# Patient Record
Sex: Female | Born: 1955
Health system: Southern US, Community
[De-identification: ages and names within clinical notes are randomized; demographics above are authoritative.]

## PROBLEM LIST (undated history)

## (undated) DIAGNOSIS — D250 Submucous leiomyoma of uterus: Secondary | ICD-10-CM

## (undated) DIAGNOSIS — N950 Postmenopausal bleeding: Secondary | ICD-10-CM

## (undated) DIAGNOSIS — D219 Benign neoplasm of connective and other soft tissue, unspecified: Secondary | ICD-10-CM

## (undated) DIAGNOSIS — D500 Iron deficiency anemia secondary to blood loss (chronic): Secondary | ICD-10-CM

## (undated) DIAGNOSIS — Z972 Presence of dental prosthetic device (complete) (partial): Secondary | ICD-10-CM

## (undated) HISTORY — PX: NO PAST SURGERIES: SHX2092

---

## 1998-02-05 ENCOUNTER — Ambulatory Visit (HOSPITAL_COMMUNITY): Admission: RE | Admit: 1998-02-05 | Discharge: 1998-02-05 | Payer: Self-pay | Admitting: Gastroenterology

## 1998-04-30 ENCOUNTER — Observation Stay (HOSPITAL_COMMUNITY): Admission: EM | Admit: 1998-04-30 | Discharge: 1998-04-30 | Payer: Self-pay | Admitting: Emergency Medicine

## 1998-11-22 ENCOUNTER — Emergency Department (HOSPITAL_COMMUNITY): Admission: EM | Admit: 1998-11-22 | Discharge: 1998-11-22 | Payer: Self-pay | Admitting: Emergency Medicine

## 2000-03-25 ENCOUNTER — Encounter: Admission: RE | Admit: 2000-03-25 | Discharge: 2000-03-25 | Payer: Self-pay

## 2000-04-01 ENCOUNTER — Encounter: Admission: RE | Admit: 2000-04-01 | Discharge: 2000-04-01 | Payer: Self-pay

## 2000-04-29 ENCOUNTER — Encounter: Admission: RE | Admit: 2000-04-29 | Discharge: 2000-04-29 | Payer: Self-pay | Admitting: Internal Medicine

## 2000-05-23 ENCOUNTER — Encounter: Admission: RE | Admit: 2000-05-23 | Discharge: 2000-05-23 | Payer: Self-pay | Admitting: Internal Medicine

## 2000-08-02 ENCOUNTER — Encounter: Admission: RE | Admit: 2000-08-02 | Discharge: 2000-08-02 | Payer: Self-pay | Admitting: Internal Medicine

## 2000-08-11 ENCOUNTER — Emergency Department (HOSPITAL_COMMUNITY): Admission: EM | Admit: 2000-08-11 | Discharge: 2000-08-11 | Payer: Self-pay | Admitting: Internal Medicine

## 2000-08-21 ENCOUNTER — Emergency Department (HOSPITAL_COMMUNITY): Admission: EM | Admit: 2000-08-21 | Discharge: 2000-08-21 | Payer: Self-pay | Admitting: Emergency Medicine

## 2000-08-25 ENCOUNTER — Encounter: Admission: RE | Admit: 2000-08-25 | Discharge: 2000-08-25 | Payer: Self-pay | Admitting: Internal Medicine

## 2000-11-16 ENCOUNTER — Emergency Department (HOSPITAL_COMMUNITY): Admission: EM | Admit: 2000-11-16 | Discharge: 2000-11-17 | Payer: Self-pay | Admitting: Emergency Medicine

## 2000-11-18 ENCOUNTER — Encounter: Admission: RE | Admit: 2000-11-18 | Discharge: 2000-11-18 | Payer: Self-pay | Admitting: Internal Medicine

## 2000-12-01 ENCOUNTER — Emergency Department (HOSPITAL_COMMUNITY): Admission: EM | Admit: 2000-12-01 | Discharge: 2000-12-01 | Payer: Self-pay | Admitting: *Deleted

## 2000-12-08 ENCOUNTER — Emergency Department (HOSPITAL_COMMUNITY): Admission: EM | Admit: 2000-12-08 | Discharge: 2000-12-08 | Payer: Self-pay | Admitting: Emergency Medicine

## 2000-12-09 ENCOUNTER — Encounter: Payer: Self-pay | Admitting: Emergency Medicine

## 2001-04-23 ENCOUNTER — Inpatient Hospital Stay (HOSPITAL_COMMUNITY): Admission: EM | Admit: 2001-04-23 | Discharge: 2001-04-24 | Payer: Self-pay | Admitting: *Deleted

## 2001-08-10 ENCOUNTER — Encounter: Admission: RE | Admit: 2001-08-10 | Discharge: 2001-08-10 | Payer: Self-pay | Admitting: Internal Medicine

## 2001-12-04 ENCOUNTER — Encounter: Admission: RE | Admit: 2001-12-04 | Discharge: 2001-12-04 | Payer: Self-pay | Admitting: Internal Medicine

## 2001-12-18 ENCOUNTER — Encounter: Admission: RE | Admit: 2001-12-18 | Discharge: 2001-12-18 | Payer: Self-pay | Admitting: Internal Medicine

## 2002-02-01 ENCOUNTER — Encounter: Admission: RE | Admit: 2002-02-01 | Discharge: 2002-02-01 | Payer: Self-pay | Admitting: Family Medicine

## 2002-02-01 ENCOUNTER — Encounter: Payer: Self-pay | Admitting: Family Medicine

## 2002-05-15 ENCOUNTER — Emergency Department (HOSPITAL_COMMUNITY): Admission: EM | Admit: 2002-05-15 | Discharge: 2002-05-15 | Payer: Self-pay | Admitting: Emergency Medicine

## 2002-05-18 ENCOUNTER — Encounter: Admission: RE | Admit: 2002-05-18 | Discharge: 2002-05-18 | Payer: Self-pay | Admitting: Internal Medicine

## 2003-06-07 ENCOUNTER — Emergency Department (HOSPITAL_COMMUNITY): Admission: EM | Admit: 2003-06-07 | Discharge: 2003-06-07 | Payer: Self-pay | Admitting: Family Medicine

## 2003-06-12 ENCOUNTER — Encounter: Admission: RE | Admit: 2003-06-12 | Discharge: 2003-06-12 | Payer: Self-pay | Admitting: Internal Medicine

## 2003-06-20 ENCOUNTER — Emergency Department (HOSPITAL_COMMUNITY): Admission: EM | Admit: 2003-06-20 | Discharge: 2003-06-20 | Payer: Self-pay | Admitting: Family Medicine

## 2009-01-15 ENCOUNTER — Emergency Department (HOSPITAL_COMMUNITY): Admission: EM | Admit: 2009-01-15 | Discharge: 2009-01-15 | Payer: Self-pay | Admitting: Emergency Medicine

## 2010-04-13 ENCOUNTER — Emergency Department (HOSPITAL_COMMUNITY): Payer: No Typology Code available for payment source

## 2010-04-13 ENCOUNTER — Emergency Department (HOSPITAL_COMMUNITY)
Admission: EM | Admit: 2010-04-13 | Discharge: 2010-04-13 | Disposition: A | Discharge status: Home / Self Care | Payer: No Typology Code available for payment source | Attending: Emergency Medicine | Admitting: Emergency Medicine

## 2010-04-13 DIAGNOSIS — R079 Chest pain, unspecified: Secondary | ICD-10-CM | POA: Insufficient documentation

## 2010-04-13 DIAGNOSIS — M546 Pain in thoracic spine: Secondary | ICD-10-CM | POA: Insufficient documentation

## 2010-04-13 DIAGNOSIS — S139XXA Sprain of joints and ligaments of unspecified parts of neck, initial encounter: Secondary | ICD-10-CM | POA: Insufficient documentation

## 2010-04-13 DIAGNOSIS — M549 Dorsalgia, unspecified: Secondary | ICD-10-CM | POA: Insufficient documentation

## 2010-04-13 DIAGNOSIS — M542 Cervicalgia: Secondary | ICD-10-CM | POA: Insufficient documentation

## 2010-04-13 DIAGNOSIS — IMO0002 Reserved for concepts with insufficient information to code with codable children: Secondary | ICD-10-CM | POA: Insufficient documentation

## 2010-04-13 DIAGNOSIS — M25569 Pain in unspecified knee: Secondary | ICD-10-CM | POA: Insufficient documentation

## 2010-07-03 NOTE — Discharge Summary (Signed)
[place]  Patient:    Lindsay Carroll, Lindsay Carroll Visit Number: [id number] MRN: [id number]          Service Type: MED Location: [id number] Attending Physician:  Levy Sjogren Dictated by:   Jennette Kettle, M.D. Admit Date:  04/23/2001 Discharge Date: 04/24/2001   CC:         Lindsay Carroll, M.D.   Discharge Summary  ATTENDING PHYSICIAN:  Fransisco Hertz, M.D.  MEDICAL RESIDENT:  Jennette Kettle, M.D.  DISCHARGE DIAGNOSES: 1. 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 2. Patient has a history of ALLERGIES to APPLE PRODUCTS. 3. Status post polypectomy 1997 (benign).  DISCHARGE MEDICATIONS: 1. Prednisone 20 mg p.o. q.d. times five days until complete. 2. EpiPen 0.3 mg autoinjector, one injection p.r.n. anaphylaxis.  FOLLOW UP: Patient has a follow up appointment with Dr. Rennis Carroll on May 17, 2001 at 2:55 p.m.  This is in Dr. Norval Gable continuity clinic.  The patient needs EpiPen prescriptions q. yearly.  ADMISSION HISTORY OF PRESENT ILLNESS:  Briefly, the patient is a 55 year old African-American female with history of allergic reaction to apple products. On Sunday 04/23/01 the patient began having an anaphylactic reaction to cranberry juice which she took that morning.  She began having increased pruritus on her hands and her chest.  The patients tongue began to swell. This was associated with nausea and vomiting, abdominal pain.  The patient had a brief syncopal episode with loss of consciousness two to three minutes without seizure activity.  EMS was called as the patient was at [facility].  At the time EMS arrived the patients blood pressure was decreased at 60/40 and the patient was diaphoretic.  The patient was given racemic epinephrine times one as well as Benadryl and was brought to the emergency department.  ADMISSION LABORATORY DATA:  CBC:  Revealed white blood cell count 20.1, (steroids had already been given at this point), hemoglobin 11.4,  platelet count 387K.  ELECTROLYTES:  Sodium 138, potassium 3.9, chloride 106, bicarb 25, glucose 110, BUN 6, creatinine 0.7, bilirubin 0.5.  Alkaline phosphatase was 68, AST was 24, ALT 23, protein 7.1, albumin 3.3, calcium 8.4.  HOSPITAL COURSE: #1 - ANAPHYLAXIS REACTION TO APPLE PRODUCTS.  The patient was initially admitted for 23 hour observation.  The patient upon arrival to the emergency department was stable with a blood pressure of 101/84 which was increased from her initial EMS report of 60/40.  The patient was not in any respiratory distress.  There was no evidence of stridor on physical examination.  The patients room air saturations were 99%.  The patient was monitored for 24 hours without any subsequent decline in her respiratory status or any new increased pleuritis or skin lesions.  The patient was given epinephrine and Benadryl en route and was placed on intravenous Solu-Medrol for her brief hospital course.  The patient was discharged on prednisone 20 mg p.o. q.d. for the next five days.  The patient was also given a prescription for an EpiPen. The patients last EpiPen refill according to the patient was two to three years ago.  The patient was advised on a yearly basis that she have her EpiPen replaced if she has not used it.  The patient was also advised to carry her EpiPen with her at all times.  The patient did not have her EpiPen with her when she was at [facility] the morning of admission.  The patient did have an elevated white count upon admission, however, steroids had  already been given.  The patients discharge white blood cell count was 19.5.  FOLLOW UP:  According to the patient it has been quite  a while since she has seen a primary care physician.  We will arrange for follow up 05/17/01 with her primary care physician for routine health maintenance. Dictated by:   Jennette Kettle, M.D. Attending Physician:  Levy Sjogren DD:  04/24/01 TD:   04/25/01 Job: [id number]

## 2012-09-08 ENCOUNTER — Emergency Department (HOSPITAL_COMMUNITY)
Admission: EM | Admit: 2012-09-08 | Discharge: 2012-09-08 | Disposition: A | Discharge status: Home / Self Care | Payer: Self-pay | Attending: Emergency Medicine | Admitting: Emergency Medicine

## 2012-09-08 ENCOUNTER — Encounter (HOSPITAL_COMMUNITY): Payer: Self-pay

## 2012-09-08 ENCOUNTER — Emergency Department (HOSPITAL_COMMUNITY): Payer: Self-pay

## 2012-09-08 DIAGNOSIS — IMO0001 Reserved for inherently not codable concepts without codable children: Secondary | ICD-10-CM | POA: Insufficient documentation

## 2012-09-08 DIAGNOSIS — J3489 Other specified disorders of nose and nasal sinuses: Secondary | ICD-10-CM | POA: Insufficient documentation

## 2012-09-08 DIAGNOSIS — R51 Headache: Secondary | ICD-10-CM | POA: Insufficient documentation

## 2012-09-08 DIAGNOSIS — R000 Tachycardia, unspecified: Secondary | ICD-10-CM | POA: Insufficient documentation

## 2012-09-08 DIAGNOSIS — Z79899 Other long term (current) drug therapy: Secondary | ICD-10-CM | POA: Insufficient documentation

## 2012-09-08 DIAGNOSIS — J159 Unspecified bacterial pneumonia: Secondary | ICD-10-CM | POA: Insufficient documentation

## 2012-09-08 DIAGNOSIS — J189 Pneumonia, unspecified organism: Secondary | ICD-10-CM

## 2012-09-08 DIAGNOSIS — R197 Diarrhea, unspecified: Secondary | ICD-10-CM | POA: Insufficient documentation

## 2012-09-08 DIAGNOSIS — R112 Nausea with vomiting, unspecified: Secondary | ICD-10-CM | POA: Insufficient documentation

## 2012-09-08 DIAGNOSIS — R05 Cough: Secondary | ICD-10-CM | POA: Insufficient documentation

## 2012-09-08 DIAGNOSIS — R059 Cough, unspecified: Secondary | ICD-10-CM | POA: Insufficient documentation

## 2012-09-08 LAB — COMPREHENSIVE METABOLIC PANEL
AST: 52 U/L — ABNORMAL HIGH (ref 0–37)
Albumin: 3.5 g/dL (ref 3.5–5.2)
BUN: 9 mg/dL (ref 6–23)
Calcium: 9.1 mg/dL (ref 8.4–10.5)
Creatinine, Ser: 0.76 mg/dL (ref 0.50–1.10)
GFR calc non Af Amer: >90 mL/min (ref >90)

## 2012-09-08 LAB — CBC WITH DIFFERENTIAL/PLATELET
Basophils Absolute: 0 10*3/uL (ref 0.0–0.1)
Lymphs Abs: 2.1 10*3/uL (ref 0.7–4.0)
MCH: 27.1 pg (ref 26.0–34.0)
MCHC: 32.8 g/dL (ref 30.0–36.0)
MCV: 82.6 fL (ref 78.0–100.0)
Monocytes Absolute: 1.5 10*3/uL — ABNORMAL HIGH (ref 0.1–1.0)
Neutro Abs: 11.6 10*3/uL — ABNORMAL HIGH (ref 1.7–7.7)
Platelets: 217 10*3/uL (ref 150–400)
RDW: 13.5 % (ref 11.5–15.5)

## 2012-09-08 MED ORDER — ACETAMINOPHEN 325 MG PO TABS
ORAL_TABLET | ORAL | Status: AC
  Filled 2012-09-08: qty 2

## 2012-09-08 MED ORDER — AZITHROMYCIN 250 MG PO TABS
500.0000 mg | ORAL_TABLET | Freq: Once | ORAL | Status: AC
  Administered 2012-09-08: 500 mg via ORAL
  Filled 2012-09-08: qty 2

## 2012-09-08 MED ORDER — ONDANSETRON HCL 4 MG/2ML IJ SOLN
4.0000 mg | Freq: Once | INTRAMUSCULAR | Status: AC
  Administered 2012-09-08: 4 mg via INTRAVENOUS
  Filled 2012-09-08: qty 2

## 2012-09-08 MED ORDER — SODIUM CHLORIDE 0.9 % IV BOLUS (SEPSIS)
1000.0000 mL | Freq: Once | INTRAVENOUS | Status: AC
  Administered 2012-09-08: 1000 mL via INTRAVENOUS

## 2012-09-08 MED ORDER — ACETAMINOPHEN 325 MG PO TABS
650.0000 mg | ORAL_TABLET | Freq: Once | ORAL | Status: AC
  Administered 2012-09-08: 650 mg via ORAL

## 2012-09-08 MED ORDER — CEPHALEXIN 500 MG PO CAPS: 500.0000 mg | ORAL_CAPSULE | Freq: Four times a day (QID) | ORAL | Status: DC

## 2012-09-08 MED ORDER — AZITHROMYCIN 250 MG PO TABS: 250.0000 mg | ORAL_TABLET | Freq: Every day | ORAL | Status: DC

## 2012-09-08 MED ORDER — IBUPROFEN 200 MG PO TABS
600.0000 mg | ORAL_TABLET | Freq: Once | ORAL | Status: AC
  Administered 2012-09-08: 600 mg via ORAL
  Filled 2012-09-08: qty 3

## 2012-09-08 MED ORDER — DEXTROSE 5 % IV SOLN
1.0000 g | Freq: Once | INTRAVENOUS | Status: AC
  Administered 2012-09-08: 1 g via INTRAVENOUS
  Filled 2012-09-08: qty 10

## 2012-09-08 NOTE — ED Notes (Signed)
Pt c/o flu like s/s since Tuesday with chills/fever, then a severe headache on Wednesday, n/v/d today

## 2012-09-08 NOTE — ED Provider Notes (Signed)
CSN: [id number]     Arrival date & time 09/08/12  1317 History     First MD Initiated Contact with Patient 09/08/12 1333     Chief Complaint  Patient presents with  . Headache  . Fever  . Emesis  . Diarrhea   (Consider location/radiation/quality/duration/timing/severity/associated sxs/prior Treatment) Patient is a 57 y.o. female presenting with fever, vomiting, and diarrhea. The history is provided by the patient.  Fever Max temp prior to arrival:  102 Temp source:  Oral Severity:  Moderate Onset quality:  Gradual Duration:  4 days Timing:  Constant Progression:  Worsening Chronicity:  New Relieved by:  Acetaminophen Worsened by:  Nothing tried Associated symptoms: chills, congestion, cough, diarrhea, myalgias, nausea and vomiting   Associated symptoms: no chest pain, no confusion, no dysuria, no ear pain, no rash and no sore throat   Cough:    Cough characteristics:  Non-productive, dry and hacking   Severity:  Moderate   Onset quality:  Gradual   Duration:  4 days   Timing:  Constant   Progression:  Unchanged   Chronicity:  New Diarrhea:    Quality:  Watery   Number of occurrences:  3   Severity:  Mild   Duration:  1 day   Timing:  Intermittent   Progression:  Unchanged Vomiting:    Quality:  Stomach contents   Number of occurrences:  2   Severity:  Mild   Duration:  1 day   Timing:  Sporadic   Progression:  Unchanged Risk factors comment:  Possible sick contacts Emesis Associated symptoms: chills, diarrhea and myalgias   Associated symptoms: no abdominal pain and no sore throat   Diarrhea Associated symptoms: chills, fever, myalgias and vomiting   Associated symptoms: no abdominal pain     History reviewed. No pertinent past medical history. History reviewed. No pertinent past surgical history. No family history on file. History  Substance Use Topics  . Smoking status: Never Smoker   . Smokeless tobacco: Not on file  . Alcohol Use: No   OB  History   Grav Para Term Preterm Abortions TAB SAB Ect Mult Living                 Review of Systems  Constitutional: Positive for fever and chills.  HENT: Positive for congestion. Negative for ear pain, sore throat, facial swelling, neck pain and neck stiffness.   Respiratory: Positive for cough.   Cardiovascular: Negative for chest pain.  Gastrointestinal: Positive for nausea, vomiting and diarrhea. Negative for abdominal pain.  Genitourinary: Negative for dysuria.  Musculoskeletal: Positive for myalgias.  Skin: Negative for rash.  Psychiatric/Behavioral: Negative for confusion and decreased concentration.  All other systems reviewed and are negative.    Allergies  Apple  Home Medications   Current Outpatient Rx  Name  Route  Sig  Dispense  Refill  . dextromethorphan (DELSYM) 30 MG/5ML liquid   Oral   Take 60 mg by mouth as needed for cough.         Marland Kitchen ibuprofen (ADVIL,MOTRIN) 200 MG tablet   Oral   Take 400 mg by mouth every 6 (six) hours as needed for pain.         . naproxen sodium (ANAPROX) 220 MG tablet   Oral   Take 220 mg by mouth 2 (two) times daily with a meal.          BP 129/73  Pulse 125  Temp(Src) 103.2 F (39.6 C)  Resp 16  SpO2 96% Physical Exam  Nursing note and vitals reviewed. Constitutional: She is oriented to person, place, and time. She appears well-developed and well-nourished. No distress.  HENT:  Head: Normocephalic and atraumatic.  Mouth/Throat: Oropharynx is clear and moist. Mucous membranes are dry.  Eyes: Conjunctivae and EOM are normal. Pupils are equal, round, and reactive to light.  Neck: Normal range of motion. Neck supple. No spinous process tenderness and no muscular tenderness present. No Brudzinski's sign and no Kernig's sign noted.  Cardiovascular: Regular rhythm and intact distal pulses.  Tachycardia present.   No murmur heard. Pulmonary/Chest: Effort normal and breath sounds normal. No respiratory distress. She has  no wheezes. She has no rales.  Abdominal: Soft. She exhibits no distension. There is no tenderness. There is no rebound and no guarding.  Musculoskeletal: Normal range of motion. She exhibits no edema and no tenderness.  Neurological: She is alert and oriented to person, place, and time.  Skin: Skin is warm and dry. No rash noted. No erythema.  Psychiatric: She has a normal mood and affect. Her behavior is normal.    ED Course   Procedures (including critical care time)  Labs Reviewed  CBC WITH DIFFERENTIAL - Abnormal; Notable for the following:    WBC 15.2 (*)    Neutro Abs 11.6 (*)    Monocytes Absolute 1.5 (*)    All other components within normal limits  COMPREHENSIVE METABOLIC PANEL - Abnormal; Notable for the following:    Sodium 130 (*)    Potassium 3.3 (*)    Chloride 92 (*)    Glucose, Bld 227 (*)    AST 52 (*)    ALT 47 (*)    All other components within normal limits  URINALYSIS, ROUTINE W REFLEX MICROSCOPIC   Dg Chest 2 View  09/08/2012   *RADIOLOGY REPORT*  Clinical Data: Cough and fever.  CHEST - 2 VIEW  Comparison: None.  Findings: Confluent opacity is seen in the anterior and medial right upper lobe, suspicious for pneumonia.  The left lung is clear.  No evidence of pleural effusion.  Heart size is normal.  IMPRESSION: Confluent opacity in the anterior and medial right upper lobe, suspicious for pneumonia.  Radiographic followup is recommended to confirm resolution.   Original Report Authenticated By: Myles Rosenthal, M.D.   1. CAP (community acquired pneumonia)     MDM   Patient with 4 days of flulike illness with fever, chills, cough and vomiting and diarrhea that started today.  Patient is febrile here to 103 and tachycardic. She is nontoxic appearing and does not display any symptoms concerning for meningitis. She denies any possibility of tick bite and has no rash. She has no focal abdominal tenderness and has clear lungs but has complained of a cough.  CBC, CMP,  UA, chest x-ray pending. Patient has no prior medical history and takes no medications regularly. Feel most likely this is viral in nature however will rule out pneumonia or UTI as the cause of patient's symptoms.  2:37 PM CXR consistent with PNA which is consistent with her sx.  This is community acquired.  Normal sats and no med problems.  Will ensure tolerating po's but feel can go home.  Given rocephin and azithro here.  3:50 PM HR improved to 100 and temp still 102 so pt given ibuprofen.  Gwyneth Sprout, MD 09/08/12 1550

## 2012-09-08 NOTE — ED Notes (Signed)
Spoke with MD. Will recheck pt temp in 30 minutes then discharge.

## 2012-09-08 NOTE — Progress Notes (Signed)
[facility] provided pt with [facility] app, as well as, a list of primary care resources.  °

## 2012-10-10 ENCOUNTER — Emergency Department (INDEPENDENT_AMBULATORY_CARE_PROVIDER_SITE_OTHER)
Admission: EM | Admit: 2012-10-10 | Discharge: 2012-10-10 | Disposition: A | Discharge status: Home / Self Care | Payer: Self-pay | Source: Home / Self Care

## 2012-10-10 ENCOUNTER — Encounter (HOSPITAL_COMMUNITY): Payer: Self-pay | Admitting: Emergency Medicine

## 2012-10-10 DIAGNOSIS — I10 Essential (primary) hypertension: Secondary | ICD-10-CM

## 2012-10-10 DIAGNOSIS — J069 Acute upper respiratory infection, unspecified: Secondary | ICD-10-CM

## 2012-10-10 LAB — POCT RAPID STREP A: Streptococcus, Group A Screen (Direct): NEGATIVE

## 2012-10-10 MED ORDER — GUAIFENESIN-CODEINE 100-10 MG/5ML PO SOLN: 5.0000 mL | Freq: Three times a day (TID) | ORAL | Status: DC | PRN

## 2012-10-10 NOTE — ED Notes (Signed)
Pt c/o sore throat, fatigue, running nose, aching and uncontrollable coughing x 2 days with sxs worsening. Pt stated she had pneumonia 4 wks ago cough lightened but never went away. Pt took OTC medications for cough and cold with no relief. Pt also states she has pain in her shoulders and back x 2 days.

## 2012-10-10 NOTE — ED Provider Notes (Signed)
CSN: [id number]     Arrival date & time 10/10/12  1649 History   None    Chief Complaint  Patient presents with  . Sore Throat   (Consider location/radiation/quality/duration/timing/severity/associated sxs/prior Treatment) Patient is a 57 y.o. female presenting with pharyngitis.  Sore Throat Pertinent negatives include no abdominal pain.    Runny nose, back ache, sore throat, mild HA, cough and sneezing. Started 2 days ago. Denies CP, SOB, HA, syncope, palpitations, n/v/d/c. CAP 1 month ago, treated successfully w/ ABX but stopped taking Keflex 2 wks ago. Finished Azithro dosing.    History reviewed. No pertinent past medical history. History reviewed. No pertinent past surgical history. Family History  Problem Relation Age of Onset  . Cancer Mother 21    colon  . Cancer Father 40    colon   History  Substance Use Topics  . Smoking status: Never Smoker   . Smokeless tobacco: Not on file  . Alcohol Use: No   OB History   Grav Para Term Preterm Abortions TAB SAB Ect Mult Living                 Review of Systems  Constitutional: Negative for chills, activity change and unexpected weight change.  Gastrointestinal: Negative for nausea, vomiting, abdominal pain, diarrhea, constipation, blood in stool, anal bleeding and rectal pain.  All other systems reviewed and are negative.    Allergies  Apple  Home Medications   Current Outpatient Rx  Name  Route  Sig  Dispense  Refill  . azithromycin (ZITHROMAX) 250 MG tablet   Oral   Take 1 tablet (250 mg total) by mouth daily. Take first 2 tablets together, then 1 every day until finished.   6 tablet   0   . cephALEXin (KEFLEX) 500 MG capsule   Oral   Take 1 capsule (500 mg total) by mouth 4 (four) times daily.   28 capsule   0   . dextromethorphan (DELSYM) 30 MG/5ML liquid   Oral   Take 60 mg by mouth as needed for cough.         Marland Kitchen ibuprofen (ADVIL,MOTRIN) 200 MG tablet   Oral   Take 400 mg by mouth every 6  (six) hours as needed for pain.         . naproxen sodium (ANAPROX) 220 MG tablet   Oral   Take 220 mg by mouth 2 (two) times daily with a meal.          BP 164/94  Pulse 96  Temp(Src) 99.1 F (37.3 C) (Oral)  Resp 18  SpO2 100% Physical Exam  Constitutional: She is oriented to person, place, and time. She appears well-developed and well-nourished.  HENT:  Head: Normocephalic and atraumatic.  Right Ear: External ear normal.  Left Ear: External ear normal.  Mouth/Throat: Oropharynx is clear and moist. No oropharyngeal exudate.  Eyes: EOM are normal. Pupils are equal, round, and reactive to light.  Neck: Normal range of motion. Neck supple. No thyromegaly present.  Cardiovascular: Normal rate and intact distal pulses.   Murmur (II/VI) heard. Pulmonary/Chest: Effort normal and breath sounds normal. No respiratory distress. She has no wheezes. She has no rales. She exhibits no tenderness.  Abdominal: Bowel sounds are normal. She exhibits no distension.  Musculoskeletal: Normal range of motion.  Neurological: She is alert and oriented to person, place, and time.  Skin: Skin is warm and dry.  Psychiatric: She has a normal mood and affect. Her behavior is normal.  Judgment and thought content normal.    ED Course  Procedures (including critical care time) Labs Review Labs Reviewed - No data to display Imaging Review No results found.  MDM   1. Viral URI with cough   2. Hypertension    57yo AAF. W/ likely viral URIa dn HTN. Likely to resolve in 3-4 days - OTC motrin, Afrin, robitussin w/ codeine, saline nasal spray. Sudafed but may cause increase in BP. Pt to check BP at home and if increases above 160 SBP or feels symptomatic will not take - precautions given adn all questions answered -f/u w/ PCP for HTN.   Shelly Flatten, MD Family Medicine PGY-3 10/10/2012, 6:15 PM      Ozella Rocks, MD 10/10/12 Rickey Primus

## 2012-10-11 NOTE — ED Provider Notes (Signed)
Medical screening examination/treatment/procedure(s) were performed by a resident physician or non-physician practitioner and as the supervising physician I was immediately available for consultation/collaboration.  Clementeen Graham, MD   Rodolph Bong, MD 10/11/12 [phone]

## 2012-10-12 LAB — CULTURE, GROUP A STREP

## 2015-03-30 ENCOUNTER — Emergency Department (HOSPITAL_COMMUNITY)
Admission: EM | Admit: 2015-03-30 | Discharge: 2015-03-30 | Disposition: A | Discharge status: Home / Self Care | Payer: Self-pay | Attending: Emergency Medicine | Admitting: Emergency Medicine

## 2015-03-30 ENCOUNTER — Encounter (HOSPITAL_COMMUNITY): Payer: Self-pay | Admitting: Emergency Medicine

## 2015-03-30 ENCOUNTER — Emergency Department (HOSPITAL_COMMUNITY): Payer: Self-pay

## 2015-03-30 DIAGNOSIS — Z792 Long term (current) use of antibiotics: Secondary | ICD-10-CM | POA: Insufficient documentation

## 2015-03-30 DIAGNOSIS — J069 Acute upper respiratory infection, unspecified: Secondary | ICD-10-CM | POA: Insufficient documentation

## 2015-03-30 DIAGNOSIS — Z791 Long term (current) use of non-steroidal anti-inflammatories (NSAID): Secondary | ICD-10-CM | POA: Insufficient documentation

## 2015-03-30 LAB — RAPID STREP SCREEN (MED CTR MEBANE ONLY): Streptococcus, Group A Screen (Direct): NEGATIVE

## 2015-03-30 MED ORDER — ACETAMINOPHEN 325 MG PO TABS
650.0000 mg | ORAL_TABLET | Freq: Once | ORAL | Status: AC | PRN
  Administered 2015-03-30: 650 mg via ORAL
  Filled 2015-03-30: qty 2

## 2015-03-30 MED ORDER — FLUTICASONE PROPIONATE 50 MCG/ACT NA SUSP: 2.0000 | Freq: Every day | NASAL | Status: DC

## 2015-03-30 MED ORDER — BENZONATATE 100 MG PO CAPS: 100.0000 mg | ORAL_CAPSULE | Freq: Three times a day (TID) | ORAL | Status: DC

## 2015-03-30 NOTE — ED Notes (Signed)
Patient presents for non productive cough x3 days, upper back pain. Denies fever/chills, N/V/D.

## 2015-03-30 NOTE — ED Provider Notes (Signed)
CSN: [id number]     Arrival date & time 03/30/15  1730 History   First MD Initiated Contact with Patient 03/30/15 2040     Chief Complaint  Patient presents with  . Cough     (Consider location/radiation/quality/duration/timing/severity/associated sxs/prior Treatment) Patient is a 60 y.o. female presenting with cough. The history is provided by the patient and medical records. No language interpreter was used.  Cough Associated symptoms: sore throat   Associated symptoms: no chills, no fever, no headaches, no myalgias, no rash and no wheezing      Lindsay Carroll is a 60 y.o. female  With no pertinent PMH who presents to the Emergency Department complaining of persistent dry cough x 2 days. Associated symptoms include sore throat and nasal congestion. Patient denies fever at home, however was febrile upon arrival. Denies n/v/d. Denies chest pain. No medication or treatments taken PTA. No alleviating or aggravating factors noted. Never a smoker, no history of COPD or asthma.   History reviewed. No pertinent past medical history. History reviewed. No pertinent past surgical history. Family History  Problem Relation Age of Onset  . Cancer Mother 66    colon  . Cancer Father 50    colon   Social History  Substance Use Topics  . Smoking status: Never Smoker   . Smokeless tobacco: None  . Alcohol Use: No   OB History    No data available     Review of Systems  Constitutional: Negative for fever, chills and appetite change.  HENT: Positive for congestion and sore throat.   Eyes: Negative for visual disturbance.  Respiratory: Positive for cough. Negative for wheezing.   Cardiovascular: Negative.   Gastrointestinal: Negative for nausea, vomiting and abdominal pain.  Genitourinary: Negative for dysuria.  Musculoskeletal: Negative for myalgias and neck pain.  Skin: Negative for rash.  Neurological: Negative for dizziness, weakness and headaches.      Allergies   Apple  Home Medications   Prior to Admission medications   Medication Sig Start Date End Date Taking? Authorizing Provider  azithromycin (ZITHROMAX) 250 MG tablet Take 1 tablet (250 mg total) by mouth daily. Take first 2 tablets together, then 1 every day until finished. 09/08/12   Blanchie Dessert, MD  benzonatate (TESSALON) 100 MG capsule Take 1 capsule (100 mg total) by mouth every 8 (eight) hours. 03/30/15   Ozella Almond Chibuikem Thang, PA-C  cephALEXin (KEFLEX) 500 MG capsule Take 1 capsule (500 mg total) by mouth 4 (four) times daily. 09/08/12   Blanchie Dessert, MD  dextromethorphan (DELSYM) 30 MG/5ML liquid Take 60 mg by mouth as needed for cough.    Historical Provider, MD  fluticasone (FLONASE) 50 MCG/ACT nasal spray Place 2 sprays into both nostrils daily. 03/30/15   Amerie Beaumont Pilcher Naraly Fritcher, PA-C  guaiFENesin-codeine 100-10 MG/5ML syrup Take 5 mLs by mouth 3 (three) times daily as needed for cough. 10/10/12   Waldemar Dickens, MD  ibuprofen (ADVIL,MOTRIN) 200 MG tablet Take 400 mg by mouth every 6 (six) hours as needed for pain.    Historical Provider, MD  naproxen sodium (ANAPROX) 220 MG tablet Take 220 mg by mouth 2 (two) times daily with a meal.    Historical Provider, MD   BP 128/87 mmHg  Pulse 100  Temp(Src) 101.2 F (38.4 C) (Oral)  Resp 18  SpO2 98% Physical Exam  Constitutional: She is oriented to person, place, and time. She appears well-developed and well-nourished.  Alert and in no acute distress  HENT:  Head:  Normocephalic and atraumatic.  + nasal congestion, OP with erythema, no exudates.   Neck: Normal range of motion.  Cardiovascular: Normal rate, regular rhythm and normal heart sounds.  Exam reveals no gallop and no friction rub.   No murmur heard. Pulmonary/Chest: Effort normal and breath sounds normal. No respiratory distress. She has no wheezes. She has no rales. She exhibits no tenderness.  Abdominal: Soft. She exhibits no distension. There is no tenderness.   Musculoskeletal: She exhibits no edema.  Lymphadenopathy:    She has cervical adenopathy.  Neurological: She is alert and oriented to person, place, and time.  Skin: Skin is warm and dry. No rash noted.  Psychiatric: She has a normal mood and affect. Her behavior is normal. Judgment and thought content normal.  Nursing note and vitals reviewed.   ED Course  Procedures (including critical care time) Labs Review Labs Reviewed  RAPID STREP SCREEN (NOT AT [facility])  CULTURE, GROUP A STREP [facility])    Imaging Review Dg Chest 2 View  03/30/2015  CLINICAL DATA:  Three day history of fever, productive cough, chest congestion and shortness of breath. Personal history of right upper lobe pneumonia. EXAM: CHEST  2 VIEW COMPARISON:  09/08/2012. FINDINGS: Cardiac silhouette upper normal in size to slightly enlarged, unchanged. Hilar and mediastinal contours otherwise unremarkable. Lungs clear. Bronchovascular markings normal. Pulmonary vascularity normal. No visible pleural effusions. No pneumothorax. No evidence of significant scarring at the site of the prior right upper lobe pneumonia. Visualized bony thorax intact. IMPRESSION: Stable borderline heart size.  No acute cardiopulmonary disease. Electronically Signed   By: Evangeline Dakin M.D.   On: 03/30/2015 18:22   I have personally reviewed and evaluated these images and lab results as part of my medical decision-making.   EKG Interpretation None      MDM   Final diagnoses:  URI (upper respiratory infection)   Lindsay Carroll  presents with persistent dry cough x 2-3 days. On exam, lungs are CTA bilaterally. Mild rhinorrhea and OP with erythema, no exudates. Likely viral URI. CXR with no acute cardiopulm. Dz, strep negative. Patient is agreeable to symptomatic treatment with close follow up with PCP as needed but spoke at length about emergent, changing, or worsening of symptoms that should prompt return to ER. Patient voices understanding and  is agreeable to plan. Resource guide was given to aide in PCP follow up. Patient instructed to take tylenol or motrin as needed for fever.   [facility] Mayanna Garlitz, PA-C 03/30/15 2150  Lacretia Leigh, MD 04/02/15 [phone]

## 2015-03-30 NOTE — Discharge Instructions (Signed)
1. Medications: flonase, mucinex, tessalon for cough, continue usual home medications 2. Treatment: rest, drink plenty of fluids, take tylenol or ibuprofen for fever control 3. Follow Up: Please follow up with your primary doctor in 3 days for discussion of your diagnoses and further evaluation after today's visit; if you do not have a primary care doctor use the resource guide provided to find one; Return to the ER for high fevers, difficulty breathing or other concerning symptoms   Emergency Department Resource Guide 1) Find a Doctor and Pay Out of Pocket Although you won't have to find out who is covered by your insurance plan, it is a good idea to ask around and get recommendations. You will then need to call the office and see if the doctor you have chosen will accept you as a new patient and what types of options they offer for patients who are self-pay. Some doctors offer discounts or will set up payment plans for their patients who do not have insurance, but you will need to ask so you aren't surprised when you get to your appointment.  2) Contact Your Local Health Department Not all health departments have doctors that can see patients for sick visits, but many do, so it is worth a call to see if yours does. If you don't know where your local health department is, you can check in your phone book. The CDC also has a tool to help you locate your state's health department, and many state websites also have listings of all of their local health departments.  3) Find a [facility] If your illness is not likely to be very severe or complicated, you may want to try a walk in clinic. These are popping up all over the country in pharmacies, drugstores, and shopping centers. They're usually staffed by nurse practitioners or physician assistants that have been trained to treat common illnesses and complaints. They're usually fairly quick and inexpensive. However, if you have serious medical issues or  chronic medical problems, these are probably not your best option.  No Primary Care Doctor: - Call Health Connect at  [phone] - they can help you locate a primary care doctor that  accepts your insurance, provides certain services, etc. - Physician Referral Service- [phone]  Chronic Pain Problems: Organization         Address  Phone   Notes  [facility]  [phone] Patients need to be referred by their primary care doctor.   Medication Assistance: Organization         Address  Phone   Notes  [facility] Medication [facility] [address] [place], [place] [address] [phone] --Must be a resident of [facility] -- Must have NO insurance coverage whatsoever (no Medicaid/ Medicare, etc.) -- The pt. MUST have a primary care doctor that directs their care regularly and follows them in the community   [facility]  [phone]   [employer]  [phone]    Agencies that provide inexpensive medical care: Organization         Address  Phone   Notes  [place]  [phone]   [facility]    [phone]   [facility] [place], [place] [address] [phone]   [place] [address], [place] [phone]   [facility]    [phone]   [facility]    [phone]   [place] [address], [place] Phone:  [phone], Fax:  [phone] Hours of Operation:  9 am - 6 pm, M-F.  Also accepts Medicaid/Medicare and self-pay.  [facility] for [address], [address], [place] Phone: [phone], Fax: [phone]. Hours of Operation:  8:30 am - 5:30 pm, M-F.  Also accepts Medicaid and self-pay.  [facility] [address], [place] Phone: [phone]   [place], [place], [place]  [phone], Ext. [phone] Mondays & Thursdays: 7-9 AM.  First 15 patients are seen on a first come, first serve basis.    [facility] Providers:  Organization         Address  Phone   Notes  [facility] [address], [place] [phone] Also accepts self-pay patients.  [facility] [id number] [place], [place]  [phone]   [place], [address], [place] [phone]   [facility] [address] [place] [phone]   Lucianne Lei [address], [place]   [phone] Only accepts [place] [facility] [place] patients after they have their name applied to their card.   Self-Pay (no insurance) in [facility]:  Organization         Address  Phone   Notes  Sickle Cell Patients, [facility] [place] [phone]   [facility] [place] [phone]   [facility] [place]  [place], [place],  [phone]   [facility]/Dr. Osei-Bonsu  [address] [place] or [address], [place] [phone] Phone number for both [place] and [place] locations is the same.  [facility] and [facility] [address] [place] [phone]   [facility] [address], [place] or [address] [phone]   [facility] [address], [place] [phone], phone; [phone], fax Sees patients 1st and 3rd Saturday of every month.  Must not qualify for public or private insurance (i.e. Medicaid, Medicare, [place] Health Choice, Veterans' Benefits)  Household income should be no more than 200% of the poverty level The clinic cannot treat you if you are pregnant or think you are pregnant  Sexually transmitted  diseases are not treated at the clinic.

## 2015-04-02 LAB — CULTURE, GROUP A STREP (THRC)

## 2017-05-22 IMAGING — CR DG CHEST 2V
2 series · 2 of 2 positions shown · non-contrast
Comparison: 09/08/2012.

CLINICAL DATA: Three day history of fever, productive cough, chest
congestion and shortness of breath. Personal history of right upper
lobe pneumonia.

EXAM:
CHEST  2 VIEW

[w chest pa]
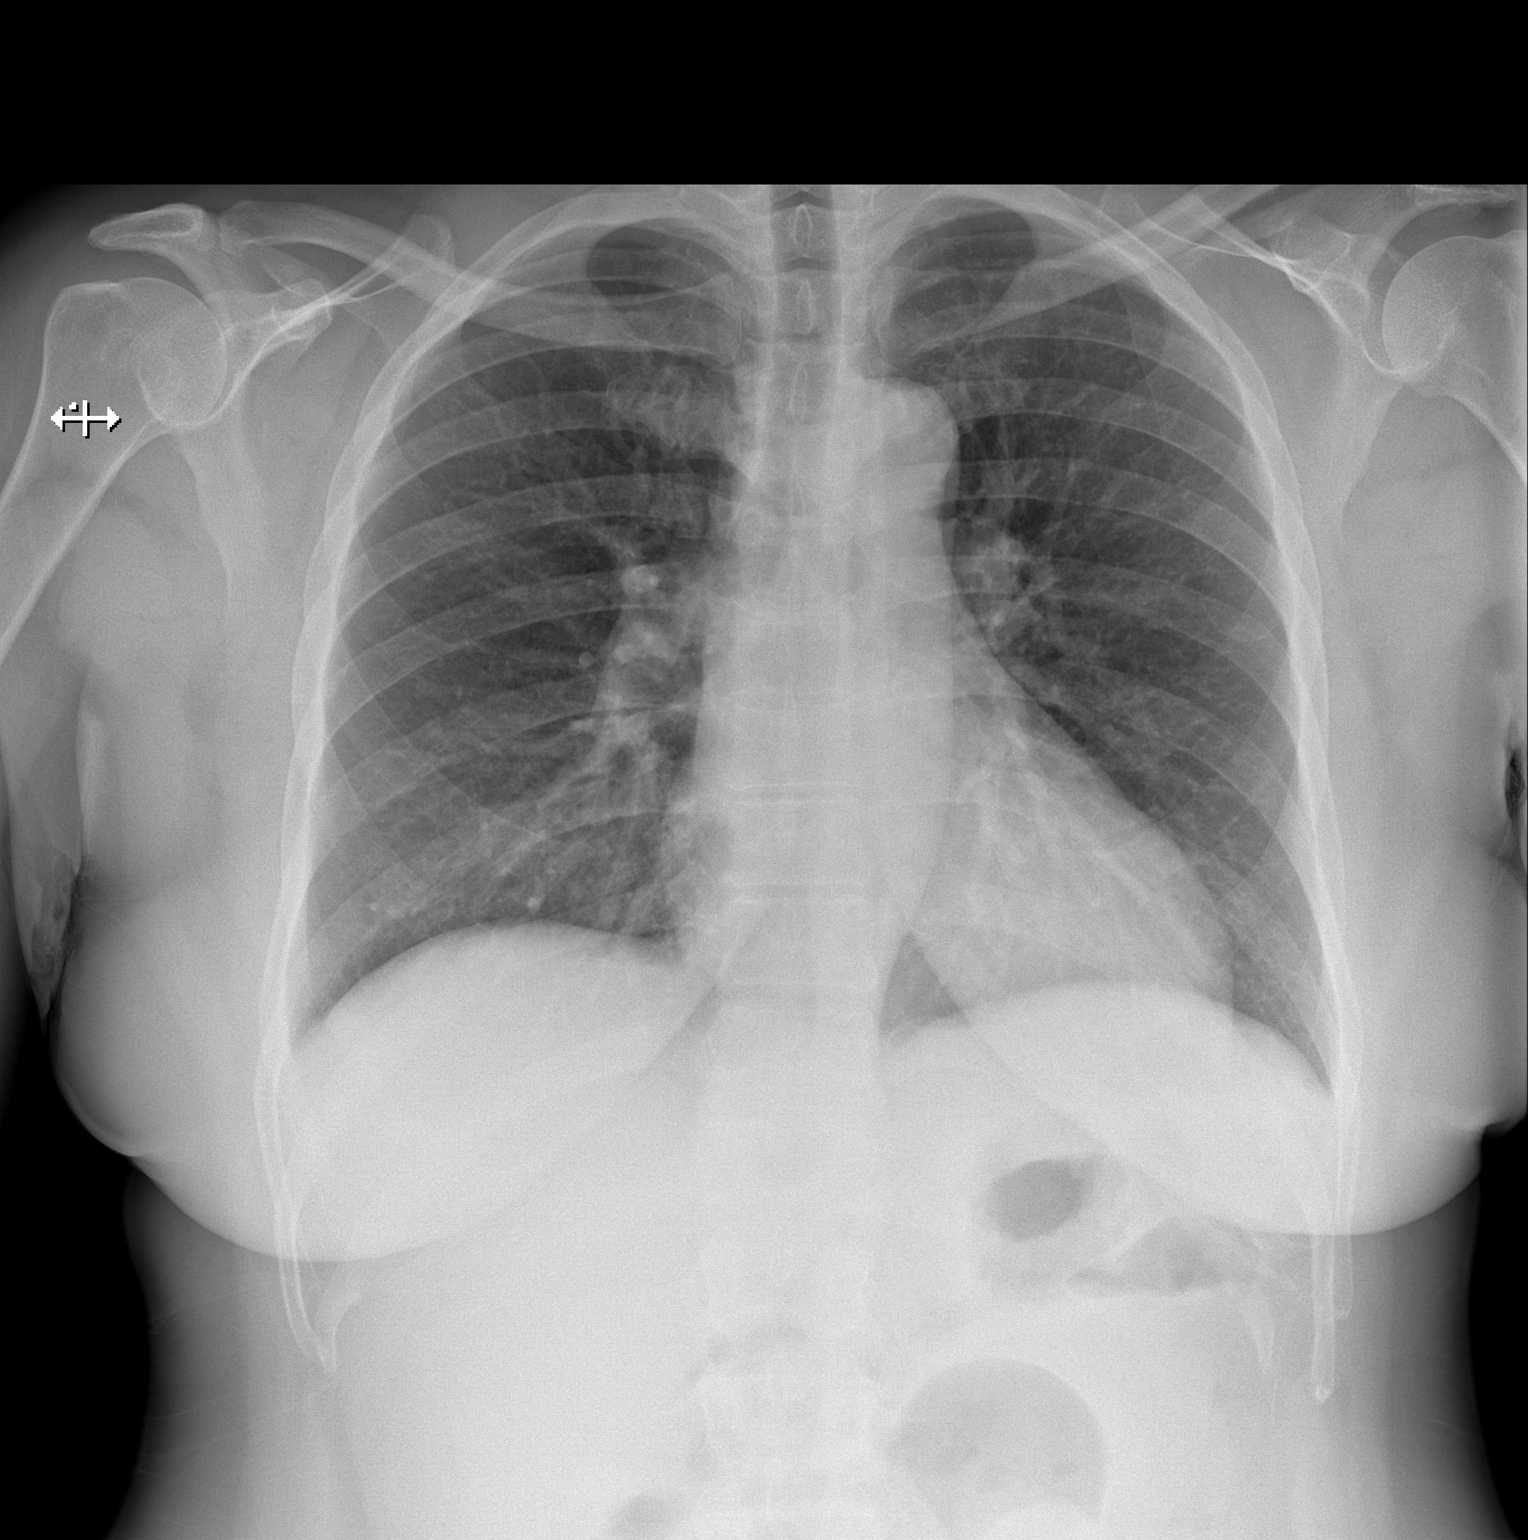

[w chest lat]
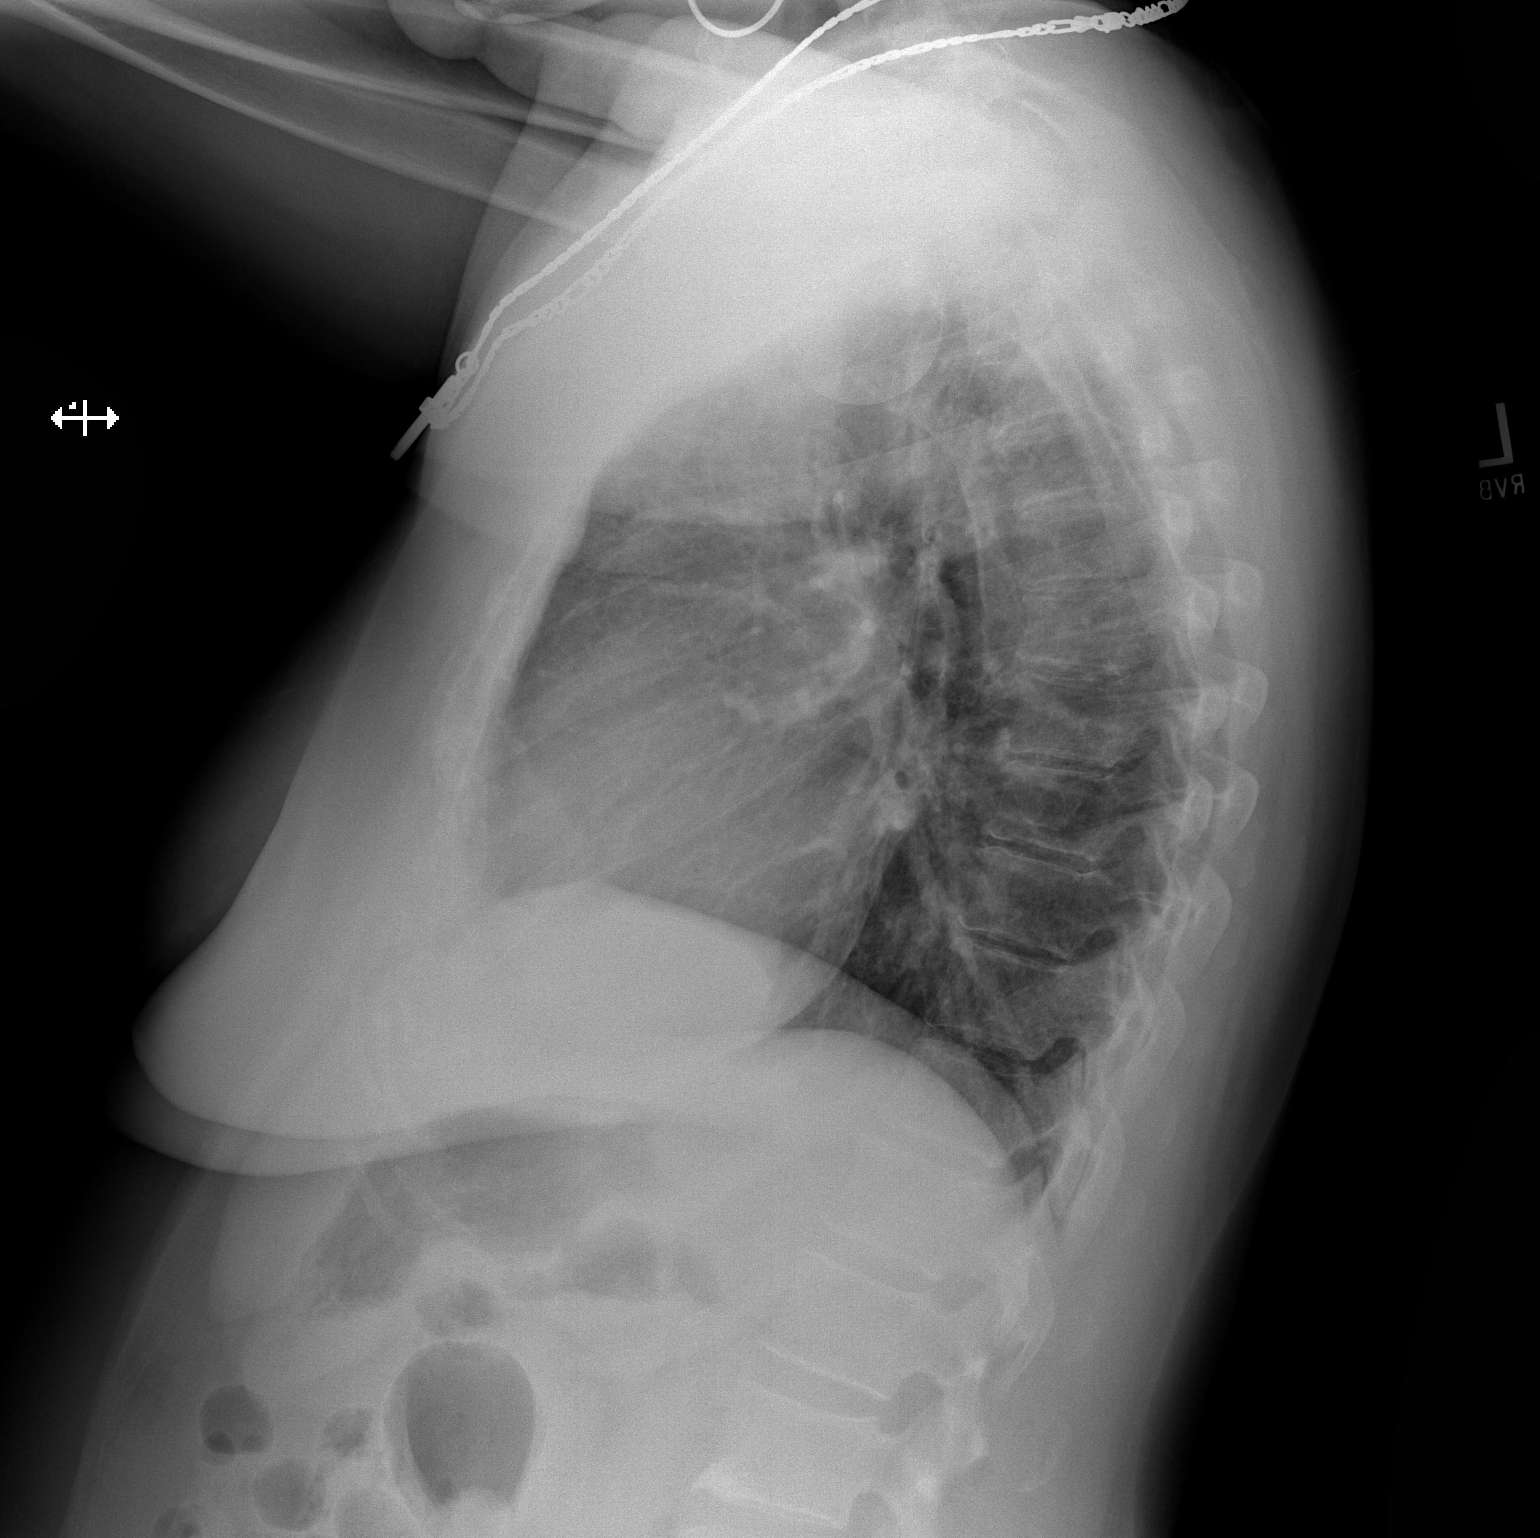

[2 of 2 positions shown; findings below may reference images not displayed]

FINDINGS: Cardiac silhouette upper normal in size to slightly enlarged,
unchanged. Hilar and mediastinal contours otherwise unremarkable.
Lungs clear. Bronchovascular markings normal. Pulmonary vascularity
normal. No visible pleural effusions. No pneumothorax. No evidence
of significant scarring at the site of the prior right upper lobe
pneumonia. Visualized bony thorax intact.
IMPRESSION: Stable borderline heart size.  No acute cardiopulmonary disease.

## 2018-01-16 ENCOUNTER — Ambulatory Visit (INDEPENDENT_AMBULATORY_CARE_PROVIDER_SITE_OTHER): Payer: PRIVATE HEALTH INSURANCE | Admitting: Otolaryngology

## 2018-01-16 DIAGNOSIS — H6122 Impacted cerumen, left ear: Secondary | ICD-10-CM

## 2018-01-16 DIAGNOSIS — J310 Chronic rhinitis: Secondary | ICD-10-CM

## 2018-01-16 DIAGNOSIS — J343 Hypertrophy of nasal turbinates: Secondary | ICD-10-CM | POA: Diagnosis not present

## 2018-01-16 DIAGNOSIS — J342 Deviated nasal septum: Secondary | ICD-10-CM | POA: Diagnosis not present

## 2018-02-27 ENCOUNTER — Ambulatory Visit (INDEPENDENT_AMBULATORY_CARE_PROVIDER_SITE_OTHER): Payer: PRIVATE HEALTH INSURANCE | Admitting: Otolaryngology

## 2018-03-17 ENCOUNTER — Encounter: Payer: Self-pay | Admitting: Obstetrics and Gynecology

## 2018-03-30 ENCOUNTER — Encounter: Payer: Self-pay | Admitting: Obstetrics and Gynecology

## 2018-03-30 ENCOUNTER — Ambulatory Visit (INDEPENDENT_AMBULATORY_CARE_PROVIDER_SITE_OTHER): Payer: PRIVATE HEALTH INSURANCE | Admitting: Obstetrics and Gynecology

## 2018-03-30 ENCOUNTER — Other Ambulatory Visit (HOSPITAL_COMMUNITY)
Admission: RE | Admit: 2018-03-30 | Discharge: 2018-03-30 | Disposition: A | Discharge status: Home / Self Care | Payer: PRIVATE HEALTH INSURANCE | Source: Ambulatory Visit | Attending: Obstetrics and Gynecology | Admitting: Obstetrics and Gynecology

## 2018-03-30 ENCOUNTER — Other Ambulatory Visit: Payer: Self-pay

## 2018-03-30 VITALS — BP 127/72 | HR 104 | Ht 64.0 in | Wt 197.6 lb

## 2018-03-30 DIAGNOSIS — N950 Postmenopausal bleeding: Secondary | ICD-10-CM | POA: Diagnosis not present

## 2018-03-30 DIAGNOSIS — Z01419 Encounter for gynecological examination (general) (routine) without abnormal findings: Secondary | ICD-10-CM | POA: Insufficient documentation

## 2018-03-30 MED ORDER — ESTROGENS, CONJUGATED 0.625 MG/GM VA CREA: 1.0000 | TOPICAL_CREAM | VAGINAL | 2 refills | Status: DC

## 2018-03-30 NOTE — Progress Notes (Signed)
Patient ID: Lindsay Carroll, female   DOB: 1955-04-24, 64 y.o.   MRN: [id number]    [facility] Visit  @DATE @            Patient name: Lindsay Carroll MRN [id number]  Date of birth: 12/22/1955  CC & HPI:  Lindsay Carroll is a 63 y.o. female NEW PT presenting today for postmenopausal bleeding. Her period stopped 7 years ago and she began spotting 2 years ago. 4 weeks ago, she began hemorrhaging for 1-1.5 hrs and she had to sit in the bathtub. She had cold sweats and thought she was going to pass out. Since then, she has just been spotting. Pt has not been sexually active "in a while." The patient denies fever or any other symptoms or complaints at this time.   ROS:  ROS  + postmenopausal bleeding - fever All systems are negative except as noted in the HPI and PMH.   Pertinent History Reviewed:   Reviewed:  Medical        History reviewed. No pertinent past medical history.                            Surgical Hx:   History reviewed. No pertinent surgical history. Medications: Reviewed & Updated - see associated section                      No current outpatient medications on file.  Social History: Reviewed -  reports that she has never smoked. She has quit using smokeless tobacco.  Objective Findings:  Vitals: Blood pressure 127/72, pulse (!) 104, height 5\' 4"  (1.626 m), weight 197 lb 9.6 oz (89.6 kg).  PHYSICAL EXAMINATION General appearance - alert, well appearing, and in no distress, oriented to person, place, and time and overweight Mental status - alert, oriented to person, place, and time, normal mood, behavior, speech, dress, motor activity, and thought processes, affect appropriate to mood Heart: 2/6 flow murmur, RRR  PELVIC Cervix - tiny, no active bleeding, no lesions seen  Assessment & Plan:   A:  1. Pap done today -- Pt unable to tolerate small speculum without extreme pain 2. Postmenopausal bleeding  P:  1. Schedule Transvaginal US 2. Pt will need  D&C in late march, will not be able to tolerate office procedure 3. Premarin VC 3x/wk By signing my name below, I, De Burrs, attest that this documentation has been prepared under the direction and in the presence of Jonnie Kind, MD. Electronically Signed: De Burrs, Medical Scribe. 03/30/18. 4:28 PM.  I personally performed the services described in this documentation, which was SCRIBED in my presence. The recorded information has been reviewed and considered accurate. It has been edited as necessary during review. Jonnie Kind, MD

## 2018-03-30 NOTE — Patient Instructions (Signed)
Dilation and Curettage or Vacuum Curettage, Care After  These instructions give you information about caring for yourself after your procedure. Your doctor may also give you more specific instructions. Call your doctor if you have any problems or questions after your procedure.  Follow these instructions at home:  Activity  · Do not drive or use heavy machinery while taking prescription pain medicine.  · For 24 hours after your procedure, avoid driving.  · Take short walks often, followed by rest periods. Ask your doctor what activities are safe for you. After one or two days, you may be able to return to your normal activities.  · Do not lift anything that is heavier than 10 lb (4.5 kg) until your doctor approves.  · For at least 2 weeks, or as long as told by your doctor:  ? Do not douche.  ? Do not use tampons.  ? Do not have sex.  General instructions    · Take over-the-counter and prescription medicines only as told by your doctor. This is very important if you take blood thinning medicine.  · Do not take baths, swim, or use a hot tub until your doctor approves. Take showers instead of baths.  · Wear compression stockings as told by your doctor.  · It is up to you to get the results of your procedure. Ask your doctor when your results will be ready.  · Keep all follow-up visits as told by your doctor. This is important.  Contact a doctor if:  · You have very bad cramps that get worse or do not get better with medicine.  · You have very bad pain in your belly (abdomen).  · You cannot drink fluids without throwing up (vomiting).  · You get pain in a different part of the area between your belly and thighs (pelvis).  · You have bad-smelling discharge from your vagina.  · You have a rash.  Get help right away if:  · You are bleeding a lot from your vagina. A lot of bleeding means soaking more than one sanitary pad in an hour, for 2 hours in a row.  · You have clumps of blood (blood clots) coming from your  vagina.  · You have a fever or chills.  · Your belly feels very tender or hard.  · You have chest pain.  · You have trouble breathing.  · You cough up blood.  · You feel dizzy.  · You feel light-headed.  · You pass out (faint).  · You have pain in your neck or shoulder area.  Summary  · Take short walks often, followed by rest periods. Ask your doctor what activities are safe for you. After one or two days, you may be able to return to your normal activities.  · Do not lift anything that is heavier than 10 lb (4.5 kg) until your doctor approves.  · Do not take baths, swim, or use a hot tub until your doctor approves. Take showers instead of baths.  · Contact your doctor if you have any symptoms of infection, like bad-smelling discharge from your vagina.  This information is not intended to replace advice given to you by your health care provider. Make sure you discuss any questions you have with your health care provider.  Document Released: 11/11/2007 Document Revised: 10/20/2015 Document Reviewed: 10/20/2015  Elsevier Interactive Patient Education © 2019 Elsevier Inc.

## 2018-04-04 LAB — CYTOLOGY - PAP
Diagnosis: NEGATIVE
HPV (WINDOPATH): NOT DETECTED

## 2018-04-05 ENCOUNTER — Ambulatory Visit (HOSPITAL_COMMUNITY): Admission: RE | Admit: 2018-04-05 | Payer: PRIVATE HEALTH INSURANCE | Source: Ambulatory Visit

## 2018-04-12 ENCOUNTER — Encounter: Payer: PRIVATE HEALTH INSURANCE | Admitting: Obstetrics and Gynecology

## 2018-07-29 ENCOUNTER — Other Ambulatory Visit: Payer: Self-pay

## 2018-07-29 ENCOUNTER — Encounter (HOSPITAL_COMMUNITY): Payer: Self-pay

## 2018-07-29 ENCOUNTER — Ambulatory Visit (HOSPITAL_COMMUNITY)
Admission: EM | Admit: 2018-07-29 | Discharge: 2018-07-29 | Disposition: A | Discharge status: Home / Self Care | Payer: PRIVATE HEALTH INSURANCE

## 2018-07-29 DIAGNOSIS — S0990XA Unspecified injury of head, initial encounter: Secondary | ICD-10-CM | POA: Diagnosis not present

## 2018-07-29 NOTE — Discharge Instructions (Signed)
Use anti-inflammatories for pain/swelling. You may take up to 800 mg Ibuprofen every 8 hours with food. You may supplement Ibuprofen with Tylenol [phone] mg every 8 hours.  Follow up if symptoms worsening or developing signs of infection around wound
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
## 2018-09-15 ENCOUNTER — Other Ambulatory Visit: Payer: Self-pay

## 2018-09-15 ENCOUNTER — Ambulatory Visit (INDEPENDENT_AMBULATORY_CARE_PROVIDER_SITE_OTHER): Payer: PRIVATE HEALTH INSURANCE | Admitting: Obstetrics and Gynecology

## 2018-09-15 ENCOUNTER — Encounter: Payer: Self-pay | Admitting: Obstetrics and Gynecology

## 2018-09-15 VITALS — BP 128/79 | HR 103 | Ht 64.0 in | Wt 195.2 lb

## 2018-09-15 DIAGNOSIS — Z01818 Encounter for other preprocedural examination: Secondary | ICD-10-CM

## 2018-09-15 NOTE — Progress Notes (Signed)
Patient ID: Lindsay Carroll, female   DOB: [birth year], 63 y.o.   MRN: [id number]  Preoperative History and Physical  Lindsay Carroll is a 63 y.o. G2P0020 here for surgical management of postmenopausal bleeding for 3 years. No significant preoperative concerns. Have has spotting and cramping since last visit in march. Occasionally has some heavy bleeding bleeding since February  but none at present time of appt. Was scheduled for D&C in march but was canceled due to covid  Proposed surgery: Hysteroscopy D&C  History reviewed. No pertinent past medical history. History reviewed. No pertinent surgical history. OB History  Gravida Para Term Preterm AB Living  2       2    SAB TAB Ectopic Multiple Live Births               # Outcome Date GA Lbr Len/2nd Weight Sex Delivery Anes PTL Lv  2 AB           1 AB           Patient denies any other pertinent gynecologic issues.   Current Outpatient Medications on File Prior to Visit  Medication Sig Dispense Refill  . conjugated estrogens (PREMARIN) vaginal cream Place 1 Applicatorful vaginally 3 (three) times a week. For vaginal thinning and irritation 42.5 g 2   No current facility-administered medications on file prior to visit.    Allergies  Allergen Reactions  . Apple Hives  . Pectin     Social History:   reports that she has never smoked. She has never used smokeless tobacco. She reports that she does not drink alcohol or use drugs.  Family History  Problem Relation Age of Onset  . Cancer Mother 50       colon  . Cancer Father 72       colon    Review of Systems: Noncontributory  PHYSICAL EXAM: Blood pressure 128/79, pulse (!) 103, height 5\' 4"  (1.626 m), weight 195 lb 3.2 oz (88.5 kg). General appearance - alert, well appearing, and in no distress Chest - clear to auscultation, no wheezes, rales or rhonchi, symmetric air entry Heart - normal rate and regular rhythm Abdomen - soft, nontender, nondistended, no masses or  organomegaly Pelvic -  VAGINA: normal appearing vagina with normal color and discharge, no lesions, CERVIX: not examined,  GCCHL not collected Limited pelvic exam due to physical pain  Extremities - peripheral pulses normal, no pedal edema, no clubbing or cyanosis  Labs: No results found for this or any previous visit (from the past 336 hour(s)).  Imaging Studies: No results found.  Assessment: There are no active problems to display for this patient.   Plan: Patient will undergo surgical management with hysteroscopy D&C Schedule tv U/S for 1 week to complete w/u   By signing my name below, I, Samul Dada, attest that this documentation has been prepared under the direction and in the presence of Jonnie Kind, MD. Electronically Signed: [place]. 09/15/18. 11:52 AM.  I personally performed the services described in this documentation, which was SCRIBED in my presence. The recorded information has been reviewed and considered accurate. It has been edited as necessary during review. Jonnie Kind, MD

## 2018-10-03 ENCOUNTER — Other Ambulatory Visit: Payer: Self-pay | Admitting: Obstetrics and Gynecology

## 2018-10-03 ENCOUNTER — Telehealth: Payer: Self-pay | Admitting: Obstetrics & Gynecology

## 2018-10-03 DIAGNOSIS — N950 Postmenopausal bleeding: Secondary | ICD-10-CM

## 2018-10-03 NOTE — Patient Instructions (Signed)
Lindsay Carroll  10/03/2018     @PREFPERIOPPHARMACY @   Your procedure is scheduled on  10/10/2018 .  Report to [facility] at  [place].  Call this number if you have problems the morning of surgery:  [phone]   Remember:  Do not eat or drink after midnight.                       Take these medicines the morning of surgery with A SIP OF WATER None    Do not wear jewelry, make-up or nail polish.  Do not wear lotions, powders, or perfume. Please wear deodorant and brush your teeth.  Do not shave 48 hours prior to surgery.  Men may shave face and neck.  Do not bring valuables to the hospital.  [facility] is not responsible for any belongings or valuables.  Contacts, dentures or bridgework may not be worn into surgery.  Leave your suitcase in the car.  After surgery it may be brought to your room.  For patients admitted to the hospital, discharge time will be determined by your treatment team.  Patients discharged the day of surgery will not be allowed to drive home.   Name and phone number of your driver:   family Special instructions:  None  Please read over the following fact sheets that you were given. Anesthesia Post-op Instructions and Care and Recovery After Surgery       Endometrial Ablation Endometrial ablation is a procedure that destroys the thin inner layer of the lining of the uterus (endometrium). This procedure may be done:  To stop heavy periods.  To stop bleeding that is causing anemia.  To control irregular bleeding.  To treat bleeding caused by small tumors (fibroids) in the endometrium. This procedure is often an alternative to major surgery, such as removal of the uterus and cervix (hysterectomy). As a result of this procedure:  You may not be able to have children. However, if you are premenopausal (you have not gone through menopause): ? You may still have a small chance of getting pregnant. ? You will need to use a  reliable method of birth control after the procedure to prevent pregnancy.  You may stop having a menstrual period, or you may have only a small amount of bleeding during your period. Menstruation may return several years after the procedure. Tell a health care provider about:  Any allergies you have.  All medicines you are taking, including vitamins, herbs, eye drops, creams, and over-the-counter medicines.  Any problems you or family members have had with the use of anesthetic medicines.  Any blood disorders you have.  Any surgeries you have had.  Any medical conditions you have. What are the risks? Generally, this is a safe procedure. However, problems may occur, including:  A hole (perforation) in the uterus or bowel.  Infection of the uterus, bladder, or vagina.  Bleeding.  Damage to other structures or organs.  An air bubble in the lung (air embolus).  Problems with pregnancy after the procedure.  Failure of the procedure.  Decreased ability to diagnose cancer in the endometrium. What happens before the procedure?  You will have tests of your endometrium to make sure there are no pre-cancerous cells or cancer cells present.  You may have an ultrasound of the uterus.  You may be given medicines to thin the endometrium.  Ask your health care provider  about: ? Changing or stopping your regular medicines. This is especially important if you take diabetes medicines or blood thinners. ? Taking medicines such as aspirin and ibuprofen. These medicines can thin your blood. Do not take these medicines before your procedure if your doctor tells you not to.  Plan to have someone take you home from the hospital or clinic. What happens during the procedure?   You will lie on an exam table with your feet and legs supported as in a pelvic exam.  To lower your risk of infection: ? Your health care team will wash or sanitize their hands and put on germ-free (sterile) gloves.  ? Your genital area will be washed with soap.  An IV tube will be inserted into one of your veins.  You will be given a medicine to help you relax (sedative).  A surgical instrument with a light and camera (resectoscope) will be inserted into your vagina and moved into your uterus. This allows your surgeon to see inside your uterus.  Endometrial tissue will be removed using one of the following methods: ? Radiofrequency. This method uses a radiofrequency-alternating electric current to remove the endometrium. ? Cryotherapy. This method uses extreme cold to freeze the endometrium. ? Heated-free liquid. This method uses a heated saltwater (saline) solution to remove the endometrium. ? Microwave. This method uses high-energy microwaves to heat up the endometrium and remove it. ? Thermal balloon. This method involves inserting a catheter with a balloon tip into the uterus. The balloon tip is filled with heated fluid to remove the endometrium. The procedure may vary among health care providers and hospitals. What happens after the procedure?  Your blood pressure, heart rate, breathing rate, and blood oxygen level will be monitored until the medicines you were given have worn off.  As tissue healing occurs, you may notice vaginal bleeding for 4-6 weeks after the procedure. You may also experience: ? Cramps. ? Thin, watery vaginal discharge that is light pink or brown in color. ? A need to urinate more frequently than usual. ? Nausea.  Do not drive for 24 hours if you were given a sedative.  Do not have sex or insert anything into your vagina until your health care provider approves. Summary  Endometrial ablation is done to treat the many causes of heavy menstrual bleeding.  The procedure may be done only after medications have been tried to control the bleeding.  Plan to have someone take you home from the hospital or clinic. This information is not intended to replace advice given to  you by your health care provider. Make sure you discuss any questions you have with your health care provider. Document Released: 12/12/2003 Document Revised: 07/19/2017 Document Reviewed: 02/19/2016 Elsevier Patient Education  [place].  Dilation and Curettage or Vacuum Curettage, Care After These instructions give you information about caring for yourself after your procedure. Your doctor may also give you more specific instructions. Call your doctor if you have any problems or questions after your procedure. Follow these instructions at home: Activity  Do not drive or use heavy machinery while taking prescription pain medicine.  For 24 hours after your procedure, avoid driving.  Take short walks often, followed by rest periods. Ask your doctor what activities are safe for you. After one or two days, you may be able to return to your normal activities.  Do not lift anything that is heavier than 10 lb (4.5 kg) until your doctor approves.  For at least 2  weeks, or as long as told by your doctor: ? Do not douche. ? Do not use tampons. ? Do not have sex. General instructions   Take over-the-counter and prescription medicines only as told by your doctor. This is very important if you take blood thinning medicine.  Do not take baths, swim, or use a hot tub until your doctor approves. Take showers instead of baths.  Wear compression stockings as told by your doctor.  It is up to you to get the results of your procedure. Ask your doctor when your results will be ready.  Keep all follow-up visits as told by your doctor. This is important. Contact a doctor if:  You have very bad cramps that get worse or do not get better with medicine.  You have very bad pain in your belly (abdomen).  You cannot drink fluids without throwing up (vomiting).  You get pain in a different part of the area between your belly and thighs (pelvis).  You have bad-smelling discharge from your  vagina.  You have a rash. Get help right away if:  You are bleeding a lot from your vagina. A lot of bleeding means soaking more than one sanitary pad in an hour, for 2 hours in a row.  You have clumps of blood (blood clots) coming from your vagina.  You have a fever or chills.  Your belly feels very tender or hard.  You have chest pain.  You have trouble breathing.  You cough up blood.  You feel dizzy.  You feel light-headed.  You pass out (faint).  You have pain in your neck or shoulder area. Summary  Take short walks often, followed by rest periods. Ask your doctor what activities are safe for you. After one or two days, you may be able to return to your normal activities.  Do not lift anything that is heavier than 10 lb (4.5 kg) until your doctor approves.  Do not take baths, swim, or use a hot tub until your doctor approves. Take showers instead of baths.  Contact your doctor if you have any symptoms of infection, like bad-smelling discharge from your vagina. This information is not intended to replace advice given to you by your health care provider. Make sure you discuss any questions you have with your health care provider. Document Released: 11/11/2007 Document Revised: 01/14/2017 Document Reviewed: 10/20/2015 Elsevier Patient Education  2020 [employer] Anesthesia, Adult, Care After This sheet gives you information about how to care for yourself after your procedure. Your health care provider may also give you more specific instructions. If you have problems or questions, contact your health care provider. What can I expect after the procedure? After the procedure, the following side effects are common:  Pain or discomfort at the IV site.  Nausea.  Vomiting.  Sore throat.  Trouble concentrating.  Feeling cold or chills.  Weak or tired.  Sleepiness and fatigue.  Soreness and body aches. These side effects can affect parts of the body  that were not involved in surgery. Follow these instructions at home:  For at least 24 hours after the procedure:  Have a responsible adult stay with you. It is important to have someone help care for you until you are awake and alert.  Rest as needed.  Do not: ? Participate in activities in which you could fall or become injured. ? Drive. ? Use heavy machinery. ? Drink alcohol. ? Take sleeping pills or medicines that cause drowsiness. ?  Make important decisions or sign legal documents. ? Take care of children on your own. Eating and drinking  Follow any instructions from your health care provider about eating or drinking restrictions.  When you feel hungry, start by eating small amounts of foods that are soft and easy to digest (bland), such as toast. Gradually return to your regular diet.  Drink enough fluid to keep your urine pale yellow.  If you vomit, rehydrate by drinking water, juice, or clear broth. General instructions  If you have sleep apnea, surgery and certain medicines can increase your risk for breathing problems. Follow instructions from your health care provider about wearing your sleep device: ? Anytime you are sleeping, including during daytime naps. ? While taking prescription pain medicines, sleeping medicines, or medicines that make you drowsy.  Return to your normal activities as told by your health care provider. Ask your health care provider what activities are safe for you.  Take over-the-counter and prescription medicines only as told by your health care provider.  If you smoke, do not smoke without supervision.  Keep all follow-up visits as told by your health care provider. This is important. Contact a health care provider if:  You have nausea or vomiting that does not get better with medicine.  You cannot eat or drink without vomiting.  You have pain that does not get better with medicine.  You are unable to pass urine.  You develop a  skin rash.  You have a fever.  You have redness around your IV site that gets worse. Get help right away if:  You have difficulty breathing.  You have chest pain.  You have blood in your urine or stool, or you vomit blood. Summary  After the procedure, it is common to have a sore throat or nausea. It is also common to feel tired.  Have a responsible adult stay with you for the first 24 hours after general anesthesia. It is important to have someone help care for you until you are awake and alert.  When you feel hungry, start by eating small amounts of foods that are soft and easy to digest (bland), such as toast. Gradually return to your regular diet.  Drink enough fluid to keep your urine pale yellow.  Return to your normal activities as told by your health care provider. Ask your health care provider what activities are safe for you. This information is not intended to replace advice given to you by your health care provider. Make sure you discuss any questions you have with your health care provider. Document Released: 05/10/2000 Document Revised: 02/04/2017 Document Reviewed: 09/17/2016 Elsevier Patient Education  2020 [employer]. How to Use Chlorhexidine for Bathing Chlorhexidine gluconate (CHG) is a germ-killing (antiseptic) solution that is used to clean the skin. It can get rid of the bacteria that normally live on the skin and can keep them away for about 24 hours. To clean your skin with CHG, you may be given:  A CHG solution to use in the shower or as part of a sponge bath.  A prepackaged cloth that contains CHG. Cleaning your skin with CHG may help lower the risk for infection:  While you are staying in the intensive care unit of the hospital.  If you have a vascular access, such as a central line, to provide short-term or long-term access to your veins.  If you have a catheter to drain urine from your bladder.  If you are on a ventilator. A ventilator  is a  machine that helps you breathe by moving air in and out of your lungs.  After surgery. What are the risks? Risks of using CHG include:  A skin reaction.  Hearing loss, if CHG gets in your ears.  Eye injury, if CHG gets in your eyes and is not rinsed out.  The CHG product catching fire. Make sure that you avoid smoking and flames after applying CHG to your skin. Do not use CHG:  If you have a chlorhexidine allergy or have previously reacted to chlorhexidine.  On babies younger than 6 months of age. How to use CHG solution  Use CHG only as told by your health care provider, and follow the instructions on the label.  Use the full amount of CHG as directed. Usually, this is one bottle. During a shower Follow these steps when using CHG solution during a shower (unless your health care provider gives you different instructions): 1. Start the shower. 2. Use your normal soap and shampoo to wash your face and hair. 3. Turn off the shower or move out of the shower stream. 4. Pour the CHG onto a clean washcloth. Do not use any type of brush or rough-edged sponge. 5. Starting at your neck, lather your body down to your toes. Make sure you follow these instructions: ? If you will be having surgery, pay special attention to the part of your body where you will be having surgery. Scrub this area for at least 1 minute. ? Do not use CHG on your head or face. If the solution gets into your ears or eyes, rinse them well with water. ? Avoid your genital area. ? Avoid any areas of skin that have broken skin, cuts, or scrapes. ? Scrub your back and under your arms. Make sure to wash skin folds. 6. Let the lather sit on your skin for 1-2 minutes or as long as told by your health care provider. 7. Thoroughly rinse your entire body in the shower. Make sure that all body creases and crevices are rinsed well. 8. Dry off with a clean towel. Do not put any substances on your body afterward-such as powder,  lotion, or perfume-unless you are told to do so by your health care provider. Only use lotions that are recommended by the manufacturer. 9. Put on clean clothes or pajamas. 10. If it is the night before your surgery, sleep in clean sheets.  During a sponge bath Follow these steps when using CHG solution during a sponge bath (unless your health care provider gives you different instructions): 1. Use your normal soap and shampoo to wash your face and hair. 2. Pour the CHG onto a clean washcloth. 3. Starting at your neck, lather your body down to your toes. Make sure you follow these instructions: ? If you will be having surgery, pay special attention to the part of your body where you will be having surgery. Scrub this area for at least 1 minute. ? Do not use CHG on your head or face. If the solution gets into your ears or eyes, rinse them well with water. ? Avoid your genital area. ? Avoid any areas of skin that have broken skin, cuts, or scrapes. ? Scrub your back and under your arms. Make sure to wash skin folds. 4. Let the lather sit on your skin for 1-2 minutes or as long as told by your health care provider. 5. Using a different clean, wet washcloth, thoroughly rinse your entire body. Make sure  that all body creases and crevices are rinsed well. 6. Dry off with a clean towel. Do not put any substances on your body afterward-such as powder, lotion, or perfume-unless you are told to do so by your health care provider. Only use lotions that are recommended by the manufacturer. 7. Put on clean clothes or pajamas. 8. If it is the night before your surgery, sleep in clean sheets. How to use CHG prepackaged cloths  Only use CHG cloths as told by your health care provider, and follow the instructions on the label.  Use the CHG cloth on clean, dry skin.  Do not use the CHG cloth on your head or face unless your health care provider tells you to.  When washing with the CHG cloth: ? Avoid your  genital area. ? Avoid any areas of skin that have broken skin, cuts, or scrapes. Before surgery Follow these steps when using a CHG cloth to clean before surgery (unless your health care provider gives you different instructions): 1. Using the CHG cloth, vigorously scrub the part of your body where you will be having surgery. Scrub using a back-and-forth motion for 3 minutes. The area on your body should be completely wet with CHG when you are done scrubbing. 2. Do not rinse. Discard the cloth and let the area air-dry. Do not put any substances on the area afterward, such as powder, lotion, or perfume. 3. Put on clean clothes or pajamas. 4. If it is the night before your surgery, sleep in clean sheets.  For general bathing Follow these steps when using CHG cloths for general bathing (unless your health care provider gives you different instructions). 1. Use a separate CHG cloth for each area of your body. Make sure you wash between any folds of skin and between your fingers and toes. Wash your body in the following order, switching to a new cloth after each step: ? The front of your neck, shoulders, and chest. ? Both of your arms, under your arms, and your hands. ? Your stomach and groin area, avoiding the genitals. ? Your right leg and foot. ? Your left leg and foot. ? The back of your neck, your back, and your buttocks. 2. Do not rinse. Discard the cloth and let the area air-dry. Do not put any substances on your body afterward-such as powder, lotion, or perfume-unless you are told to do so by your health care provider. Only use lotions that are recommended by the manufacturer. 3. Put on clean clothes or pajamas. Contact a health care provider if:  Your skin gets irritated after scrubbing.  You have questions about using your solution or cloth. Get help right away if:  Your eyes become very red or swollen.  Your eyes itch badly.  Your skin itches badly and is red or swollen.  Your  hearing changes.  You have trouble seeing.  You have swelling or tingling in your mouth or throat.  You have trouble breathing.  You swallow any chlorhexidine. Summary  Chlorhexidine gluconate (CHG) is a germ-killing (antiseptic) solution that is used to clean the skin. Cleaning your skin with CHG may help to lower your risk for infection.  You may be given CHG to use for bathing. It may be in a bottle or in a prepackaged cloth to use on your skin. Carefully follow your health care provider's instructions and the instructions on the product label.  Do not use CHG if you have a chlorhexidine allergy.  Contact your health care  provider if your skin gets irritated after scrubbing. This information is not intended to replace advice given to you by your health care provider. Make sure you discuss any questions you have with your health care provider. Document Released: 10/27/2011 Document Revised: 04/20/2018 Document Reviewed: 12/30/2016 Elsevier Patient Education  2020 [employer].

## 2018-10-03 NOTE — Telephone Encounter (Signed)
Unable to contact patient w/appointment reminder/restrictions.  No number listed and emergency contact was not valid.

## 2018-10-04 ENCOUNTER — Ambulatory Visit (INDEPENDENT_AMBULATORY_CARE_PROVIDER_SITE_OTHER): Payer: PRIVATE HEALTH INSURANCE

## 2018-10-04 ENCOUNTER — Other Ambulatory Visit: Payer: Self-pay

## 2018-10-04 DIAGNOSIS — N950 Postmenopausal bleeding: Secondary | ICD-10-CM | POA: Diagnosis not present

## 2018-10-04 NOTE — Progress Notes (Signed)
PELVIC US TA/TV:enlarged heterogeneous anteverted uterus with a 5.8 x 5.4 x 5.7 cm submucosal fibroid,unable to visualized endometrium,normal left ovary,normal right ovary (limited view),pelvic pain during ultrasound,no free fluid   Chaperone:Janet

## 2018-10-05 ENCOUNTER — Telehealth: Payer: Self-pay | Admitting: Obstetrics and Gynecology

## 2018-10-05 NOTE — Telephone Encounter (Signed)
LEft message for pt to call, has a large polyp or fibroid, will need operative hysteroscopy, will reschedule for [place]

## 2018-10-06 ENCOUNTER — Encounter (HOSPITAL_COMMUNITY)
Admission: RE | Admit: 2018-10-06 | Discharge: 2018-10-06 | Disposition: A | Discharge status: Home / Self Care | Payer: PRIVATE HEALTH INSURANCE | Source: Ambulatory Visit | Attending: Obstetrics and Gynecology | Admitting: Obstetrics and Gynecology

## 2018-10-06 ENCOUNTER — Encounter (HOSPITAL_COMMUNITY): Payer: Self-pay

## 2018-10-06 ENCOUNTER — Other Ambulatory Visit (HOSPITAL_COMMUNITY)
Admission: RE | Admit: 2018-10-06 | Discharge: 2018-10-06 | Disposition: A | Discharge status: Home / Self Care | Payer: PRIVATE HEALTH INSURANCE | Source: Ambulatory Visit | Attending: Obstetrics and Gynecology | Admitting: Obstetrics and Gynecology

## 2018-10-09 ENCOUNTER — Telehealth: Payer: Self-pay | Admitting: Obstetrics and Gynecology

## 2018-10-09 NOTE — Telephone Encounter (Signed)
Patient called once again and message left on her voicemail with office number left for patient to call back to discuss her ultrasound and plans for future due to the postmenopausal bleeding and the endometrial mass

## 2018-10-09 NOTE — Telephone Encounter (Signed)
Phone call to Lindsay Carroll completed this at midday, and explained to her that she has essentially a 5-1/2 cm submucosal fibroid inside a six 1/2 centimeter uterus.  I do not think this can be effectively removed hysteroscopically, and would recommend abdominal hysterectomy.  Case reviewed with Dr. Elonda Husky, reviewing u/s pictures, and he concurs.  Pt wishes to pray about how to proceed, and to discuss with family. She will discuss and follow up call back to the office.

## 2018-10-09 NOTE — Telephone Encounter (Signed)
Pt went for Pre op at [facility] on Friday and was told her surgery was canceled but she had not been told this.  SHe said she was very upset on Friday that she did not know. I called pt this mourning to find out the details because I was not aware myself that it was cancelled . Please call pt today with results of ultrasound and to discuss your surgery plans

## 2018-10-10 ENCOUNTER — Ambulatory Visit (HOSPITAL_COMMUNITY)
Admission: RE | Admit: 2018-10-10 | Payer: PRIVATE HEALTH INSURANCE | Source: Home / Self Care | Admitting: Obstetrics and Gynecology

## 2018-10-10 ENCOUNTER — Encounter (HOSPITAL_COMMUNITY): Admission: RE | Payer: Self-pay | Source: Home / Self Care

## 2018-10-10 SURGERY — DILATATION AND CURETTAGE /HYSTEROSCOPY
Anesthesia: General

## 2018-10-18 ENCOUNTER — Encounter: Payer: PRIVATE HEALTH INSURANCE | Admitting: Obstetrics and Gynecology

## 2020-03-06 DIAGNOSIS — N950 Postmenopausal bleeding: Secondary | ICD-10-CM | POA: Diagnosis not present

## 2020-03-19 DIAGNOSIS — N950 Postmenopausal bleeding: Secondary | ICD-10-CM | POA: Diagnosis not present

## 2020-04-21 ENCOUNTER — Other Ambulatory Visit: Payer: Self-pay

## 2020-04-21 ENCOUNTER — Inpatient Hospital Stay (HOSPITAL_COMMUNITY)
Admission: EM | Admit: 2020-04-21 | Discharge: 2020-04-23 | DRG: 812 | Disposition: A | Discharge status: Home / Self Care | Payer: BC Managed Care – PPO | Attending: Obstetrics and Gynecology | Admitting: Obstetrics and Gynecology

## 2020-04-21 ENCOUNTER — Encounter (HOSPITAL_COMMUNITY): Payer: Self-pay

## 2020-04-21 DIAGNOSIS — N939 Abnormal uterine and vaginal bleeding, unspecified: Secondary | ICD-10-CM | POA: Diagnosis not present

## 2020-04-21 DIAGNOSIS — Z91018 Allergy to other foods: Secondary | ICD-10-CM | POA: Diagnosis not present

## 2020-04-21 DIAGNOSIS — Z79899 Other long term (current) drug therapy: Secondary | ICD-10-CM | POA: Diagnosis not present

## 2020-04-21 DIAGNOSIS — N950 Postmenopausal bleeding: Secondary | ICD-10-CM | POA: Diagnosis present

## 2020-04-21 DIAGNOSIS — D62 Acute posthemorrhagic anemia: Principal | ICD-10-CM | POA: Diagnosis present

## 2020-04-21 DIAGNOSIS — Z20822 Contact with and (suspected) exposure to covid-19: Secondary | ICD-10-CM | POA: Diagnosis present

## 2020-04-21 DIAGNOSIS — D259 Leiomyoma of uterus, unspecified: Secondary | ICD-10-CM | POA: Diagnosis not present

## 2020-04-21 DIAGNOSIS — D509 Iron deficiency anemia, unspecified: Secondary | ICD-10-CM | POA: Diagnosis not present

## 2020-04-21 HISTORY — DX: Benign neoplasm of connective and other soft tissue, unspecified: D21.9

## 2020-04-21 LAB — CBC WITH DIFFERENTIAL/PLATELET
Abs Immature Granulocytes: 0.07 10*3/uL (ref 0.00–0.07)
Basophils Absolute: 0.1 10*3/uL (ref 0.0–0.1)
Basophils Relative: 1 %
Eosinophils Absolute: 0 10*3/uL (ref 0.0–0.5)
Eosinophils Relative: 0 %
HCT: 18.9 % — ABNORMAL LOW (ref 36.0–46.0)
Hemoglobin: 4.5 g/dL — CL (ref 12.0–15.0)
Immature Granulocytes: 1 %
Lymphocytes Relative: 16 %
Lymphs Abs: 1.9 10*3/uL (ref 0.7–4.0)
MCH: 13.2 pg — ABNORMAL LOW (ref 26.0–34.0)
MCHC: 23.8 g/dL — ABNORMAL LOW (ref 30.0–36.0)
MCV: 55.4 fL — ABNORMAL LOW (ref 80.0–100.0)
Monocytes Absolute: 0.8 10*3/uL (ref 0.1–1.0)
Monocytes Relative: 6 %
Neutro Abs: 9.2 10*3/uL — ABNORMAL HIGH (ref 1.7–7.7)
Neutrophils Relative %: 76 %
Platelets: 781 10*3/uL — ABNORMAL HIGH (ref 150–400)
RBC: 3.41 MIL/uL — ABNORMAL LOW (ref 3.87–5.11)
RDW: 23.9 % — ABNORMAL HIGH (ref 11.5–15.5)
WBC: 12 10*3/uL — ABNORMAL HIGH (ref 4.0–10.5)
nRBC: 0.9 % — ABNORMAL HIGH (ref 0.0–0.2)

## 2020-04-21 LAB — BASIC METABOLIC PANEL
Anion gap: 12 (ref 5–15)
BUN: 8 mg/dL (ref 8–23)
CO2: 22 mmol/L (ref 22–32)
Calcium: 8.9 mg/dL (ref 8.9–10.3)
Chloride: 102 mmol/L (ref 98–111)
Creatinine, Ser: 0.64 mg/dL (ref 0.44–1.00)
GFR, Estimated: >60 mL/min (ref >60)
Glucose, Bld: 241 mg/dL — ABNORMAL HIGH (ref 70–99)
Potassium: 3.1 mmol/L — ABNORMAL LOW (ref 3.5–5.1)
Sodium: 136 mmol/L (ref 135–145)

## 2020-04-21 LAB — IRON AND TIBC
Iron: 14 ug/dL — ABNORMAL LOW (ref 28–170)
Saturation Ratios: 2 % — ABNORMAL LOW (ref 10.4–31.8)
TIBC: 617 ug/dL — ABNORMAL HIGH (ref 250–450)
UIBC: 603 ug/dL

## 2020-04-21 LAB — PREPARE RBC (CROSSMATCH)

## 2020-04-21 LAB — FOLATE: Folate: 15.4 ng/mL (ref >5.9)

## 2020-04-21 LAB — RETICULOCYTES
Immature Retic Fract: 20.3 % — ABNORMAL HIGH (ref 2.3–15.9)
RBC.: 3.43 MIL/uL — ABNORMAL LOW (ref 3.87–5.11)
Retic Count, Absolute: 68.3 10*3/uL (ref 19.0–186.0)
Retic Ct Pct: 2 % (ref 0.4–3.1)

## 2020-04-21 LAB — ABO/RH: ABO/RH(D): O POS

## 2020-04-21 LAB — FERRITIN: Ferritin: 2 ng/mL — ABNORMAL LOW (ref 11–307)

## 2020-04-21 LAB — VITAMIN B12: Vitamin B-12: 407 pg/mL (ref 180–914)

## 2020-04-21 MED ORDER — OXYCODONE-ACETAMINOPHEN 5-325 MG PO TABS
1.0000 | ORAL_TABLET | Freq: Once | ORAL | Status: AC
  Administered 2020-04-21: 1 via ORAL
  Filled 2020-04-21: qty 1

## 2020-04-21 MED ORDER — SODIUM CHLORIDE 0.9 % IV BOLUS
1000.0000 mL | Freq: Once | INTRAVENOUS | Status: AC
  Administered 2020-04-21: 1000 mL via INTRAVENOUS

## 2020-04-21 MED ORDER — SODIUM CHLORIDE 0.9 % IV SOLN
10.0000 mL/h | Freq: Once | INTRAVENOUS | Status: AC
  Administered 2020-04-21: 10 mL/h via INTRAVENOUS

## 2020-04-21 NOTE — ED Provider Notes (Signed)
[facility] DEPT Provider Note   CSN: [id number] Arrival date & time: 04/21/20  1837     History Chief Complaint  Patient presents with  . Vaginal Bleeding    Lindsay Carroll is a 65 y.o. female.  Patient states that she has a history of fibroids with irregular bleeding and her OB/GYN doctor, Dr. Vito Backers law was arranged for her to get a hysterectomy in the next few weeks.  2 days ago she started having very heavy bleeding patient complains of mild weakness  The history is provided by the patient and medical records. No language interpreter was used.  Vaginal Bleeding Quality:  Bright red and clots Severity:  Moderate Onset quality:  Sudden Timing:  Constant Progression:  Worsening Chronicity:  Recurrent Possible pregnancy: no   Associated symptoms: no abdominal pain, no back pain and no fatigue        Past Medical History:  Diagnosis Date  . Fibroids     Patient Active Problem List   Diagnosis Date Noted  . Vaginal bleeding 04/21/2020    History reviewed. No pertinent surgical history.   OB History    Gravida  2   Para      Term      Preterm      AB  2   Living        SAB      IAB      Ectopic      Multiple      Live Births              Family History  Problem Relation Age of Onset  . Cancer Mother 43       colon  . Cancer Father 3       colon    Social History   Tobacco Use  . Smoking status: Never Smoker  . Smokeless tobacco: Never Used  Vaping Use  . Vaping Use: Never used  Substance Use Topics  . Alcohol use: No  . Drug use: No    Home Medications Prior to Admission medications   Medication Sig Start Date End Date Taking? Authorizing Provider  conjugated estrogens (PREMARIN) vaginal cream Place 1 Applicatorful vaginally 3 (three) times a week. For vaginal thinning and irritation 03/31/18   Jonnie Kind, MD    Allergies    Apple and Pectin  Review of Systems   Review of  Systems  Constitutional: Negative for appetite change and fatigue.  HENT: Negative for congestion, ear discharge and sinus pressure.   Eyes: Negative for discharge.  Respiratory: Negative for cough.   Cardiovascular: Negative for chest pain.  Gastrointestinal: Negative for abdominal pain and diarrhea.  Genitourinary: Positive for vaginal bleeding. Negative for frequency and hematuria.  Musculoskeletal: Negative for back pain.  Skin: Negative for rash.  Neurological: Negative for seizures and headaches.  Psychiatric/Behavioral: Negative for hallucinations.    Physical Exam Updated Vital Signs BP 134/63 (BP Location: Left Arm)   Pulse (!) 108   Temp 98 F (36.7 C) (Oral)   Resp (!) 22   Ht 5\' 4"  (1.626 m)   Wt 88 kg   SpO2 100%   BMI 33.30 kg/m   Physical Exam Vitals and nursing note reviewed.  Constitutional:      Appearance: She is well-developed.  HENT:     Head: Normocephalic.     Nose: Nose normal.  Eyes:     General: No scleral icterus.    Extraocular Movements: EOM normal.  Conjunctiva/sclera: Conjunctivae normal.  Neck:     Thyroid: No thyromegaly.  Cardiovascular:     Rate and Rhythm: Regular rhythm. Tachycardia present.     Heart sounds: No murmur heard. No friction rub. No gallop.   Pulmonary:     Breath sounds: No stridor. No wheezing or rales.  Chest:     Chest wall: No tenderness.  Abdominal:     General: There is no distension.     Tenderness: There is abdominal tenderness. There is no rebound.     Comments: Mild lower abdominal tenderness  Musculoskeletal:        General: No edema. Normal range of motion.     Cervical back: Neck supple.  Lymphadenopathy:     Cervical: No cervical adenopathy.  Skin:    Findings: No erythema or rash.  Neurological:     Mental Status: She is alert and oriented to person, place, and time.     Motor: No abnormal muscle tone.     Coordination: Coordination normal.  Psychiatric:        Mood and Affect: Mood  and affect normal.        Behavior: Behavior normal.     ED Results / Procedures / Treatments   Labs (all labs ordered are listed, but only abnormal results are displayed) Labs Reviewed  CBC WITH DIFFERENTIAL/PLATELET - Abnormal; Notable for the following components:      Result Value   WBC 12.0 (*)    RBC 3.41 (*)    Hemoglobin 4.5 (*)    HCT 18.9 (*)    MCV 55.4 (*)    MCH 13.2 (*)    MCHC 23.8 (*)    RDW 23.9 (*)    Platelets 781 (*)    nRBC 0.9 (*)    Neutro Abs 9.2 (*)    All other components within normal limits  BASIC METABOLIC PANEL - Abnormal; Notable for the following components:   Potassium 3.1 (*)    Glucose, Bld 241 (*)    All other components within normal limits  RETICULOCYTES - Abnormal; Notable for the following components:   RBC. 3.43 (*)    Immature Retic Fract 20.3 (*)    All other components within normal limits  RESP PANEL BY RT-PCR (FLU A&B, COVID) ARPGX2  VITAMIN B12  FOLATE  IRON AND TIBC  FERRITIN  TYPE AND SCREEN  PREPARE RBC (CROSSMATCH)    EKG None  Radiology No results found.  Procedures Procedures   Medications Ordered in ED Medications  0.9 %  sodium chloride infusion (has no administration in time range)  sodium chloride 0.9 % bolus 1,000 mL (1,000 mLs Intravenous New Bag/Given 04/21/20 2056)  oxyCODONE-acetaminophen (PERCOCET/ROXICET) 5-325 MG per tablet 1 tablet (1 tablet Oral Given 04/21/20 2057)  CRITICAL CARE Performed by: Milton Ferguson Total critical care time: 35 minutes Critical care time was exclusive of separately billable procedures and treating other patients. Critical care was necessary to treat or prevent imminent or life-threatening deterioration. Critical care was time spent personally by me on the following activities: development of treatment plan with patient and/or surrogate as well as nursing, discussions with consultants, evaluation of patient's response to treatment, examination of patient, obtaining  history from patient or surrogate, ordering and performing treatments and interventions, ordering and review of laboratory studies, ordering and review of radiographic studies, pulse oximetry and re-evaluation of patient's condition.   ED Course  I have reviewed the triage vital signs and the nursing notes.  Pertinent labs &  imaging results that were available during my care of the patient were reviewed by me and considered in my medical decision making (see chart for details).   Patient is anemic with hemoglobin 4.5.  She is continuing with vaginal bleeding.  Patient is tachycardic at 120 but blood pressure is normal.  I spoke with Dr. Rutherford Limerick and he agreed with transferring the patient over to [facility] to be admitted and transfused.  He said Dr. Theresa Duty will see her in the morning MDM Rules/Calculators/A&P                         Patient with anemia from vaginal bleeding.  Patient will be admitted to [facility] and will be transfused Final Clinical Impression(s) / ED Diagnoses Final diagnoses:  None    Rx / DC Orders ED Discharge Orders    None       Milton Ferguson, MD 04/21/20 2108

## 2020-04-21 NOTE — ED Triage Notes (Signed)
Pt c/o heavy vaginal bleeding since Saturday, hx of fibroids. Pt c/o abd cramping. States she is supposed to have surgery this month for fibroids. C/o weakness and passing large clots

## 2020-04-22 ENCOUNTER — Other Ambulatory Visit: Payer: Self-pay | Admitting: Obstetrics and Gynecology

## 2020-04-22 DIAGNOSIS — D509 Iron deficiency anemia, unspecified: Secondary | ICD-10-CM | POA: Diagnosis not present

## 2020-04-22 DIAGNOSIS — N950 Postmenopausal bleeding: Secondary | ICD-10-CM | POA: Diagnosis not present

## 2020-04-22 LAB — CBC WITH DIFFERENTIAL/PLATELET
Abs Immature Granulocytes: 0.05 10*3/uL (ref 0.00–0.07)
Basophils Absolute: 0.1 10*3/uL (ref 0.0–0.1)
Basophils Relative: 1 %
Eosinophils Absolute: 0 10*3/uL (ref 0.0–0.5)
Eosinophils Relative: 0 %
HCT: 24.5 % — ABNORMAL LOW (ref 36.0–46.0)
Hemoglobin: 7.6 g/dL — ABNORMAL LOW (ref 12.0–15.0)
Immature Granulocytes: 0 %
Lymphocytes Relative: 12 %
Lymphs Abs: 1.6 10*3/uL (ref 0.7–4.0)
MCH: 20.3 pg — ABNORMAL LOW (ref 26.0–34.0)
MCHC: 31 g/dL (ref 30.0–36.0)
MCV: 65.5 fL — ABNORMAL LOW (ref 80.0–100.0)
Monocytes Absolute: 1.1 10*3/uL — ABNORMAL HIGH (ref 0.1–1.0)
Monocytes Relative: 9 %
Neutro Abs: 10.3 10*3/uL — ABNORMAL HIGH (ref 1.7–7.7)
Neutrophils Relative %: 78 %
Platelets: 501 10*3/uL — ABNORMAL HIGH (ref 150–400)
RBC: 3.74 MIL/uL — ABNORMAL LOW (ref 3.87–5.11)
WBC: 13.2 10*3/uL — ABNORMAL HIGH (ref 4.0–10.5)
nRBC: 0.2 % (ref 0.0–0.2)

## 2020-04-22 LAB — PREPARE RBC (CROSSMATCH)

## 2020-04-22 LAB — PROTIME-INR
INR: 1.3 — ABNORMAL HIGH (ref 0.8–1.2)
Prothrombin Time: 15.5 seconds — ABNORMAL HIGH (ref 11.4–15.2)

## 2020-04-22 LAB — RESP PANEL BY RT-PCR (FLU A&B, COVID) ARPGX2
Influenza A by PCR: NEGATIVE
Influenza B by PCR: NEGATIVE
SARS Coronavirus 2 by RT PCR: NEGATIVE

## 2020-04-22 LAB — APTT: aPTT: 26 seconds (ref 24–36)

## 2020-04-22 MED ORDER — MORPHINE SULFATE (PF) 4 MG/ML IV SOLN
4.0000 mg | Freq: Once | INTRAVENOUS | Status: AC
  Administered 2020-04-22: 4 mg via INTRAVENOUS
  Filled 2020-04-22: qty 1

## 2020-04-22 MED ORDER — SODIUM CHLORIDE 0.9% IV SOLUTION: Freq: Once | INTRAVENOUS | Status: DC

## 2020-04-22 MED ORDER — IBUPROFEN 600 MG PO TABS
600.0000 mg | ORAL_TABLET | Freq: Four times a day (QID) | ORAL | Status: DC | PRN
  Administered 2020-04-22: 600 mg via ORAL
  Filled 2020-04-22: qty 1

## 2020-04-22 MED ORDER — OXYCODONE-ACETAMINOPHEN 5-325 MG PO TABS
1.0000 | ORAL_TABLET | ORAL | Status: DC | PRN
  Administered 2020-04-22 – 2020-04-23 (×2): 1 via ORAL
  Filled 2020-04-22 (×2): qty 1

## 2020-04-22 MED ORDER — NORETHINDRONE ACETATE 5 MG PO TABS
5.0000 mg | ORAL_TABLET | Freq: Three times a day (TID) | ORAL | Status: DC
  Administered 2020-04-22 – 2020-04-23 (×4): 5 mg via ORAL
  Filled 2020-04-22 (×5): qty 1

## 2020-04-22 NOTE — H&P (Signed)
Lindsay Carroll is an 65 y.o. female. Seen in ER for acute AUB. Known fibroids and secondary anemia. Taking NE qd.   Pertinent Gynecological History: Menses: post-menopausal Bleeding: post menopausal bleeding Contraception: none DES exposure: denies Blood transfusions: current Sexually transmitted diseases: no past history Previous GYN Procedures: DNC  Last mammogram: na Date: na Last pap: na Date: na OB History: G2, P0   Menstrual History: Menarche age: 22 No LMP recorded. Patient is postmenopausal.    Past Medical History:  Diagnosis Date  . Fibroids     History reviewed. No pertinent surgical history.  Family History  Problem Relation Age of Onset  . Cancer Mother 4       colon  . Cancer Father 44       colon    Social History:  reports that she has never smoked. She has never used smokeless tobacco. She reports that she does not drink alcohol and does not use drugs.  Allergies:  Allergies  Allergen Reactions  . Apple Hives  . Pectin     Medications Prior to Admission  Medication Sig Dispense Refill Last Dose  . acetaminophen (TYLENOL) 500 MG tablet Take 500 mg by mouth 2 (two) times daily as needed for moderate pain.   04/19/2020  . naproxen sodium (ALEVE) 220 MG tablet Take 220 mg by mouth daily as needed (pain).   04/18/2020  . norethindrone (AYGESTIN) 5 MG tablet Take 5 mg by mouth daily.   04/18/2020  . conjugated estrogens (PREMARIN) vaginal cream Place 1 Applicatorful vaginally 3 (three) times a week. For vaginal thinning and irritation (Patient not taking: No sig reported) 42.5 g 2 Not Taking at Unknown time    Review of Systems  Constitutional: Negative.   All other systems reviewed and are negative.   Blood pressure (!) 156/79, pulse 94, temperature 98.4 F (36.9 C), temperature source Oral, resp. rate 17, height 5\' 4"  (1.626 m), weight 88 kg, SpO2 100 %. Physical Exam Constitutional:      Appearance: Normal appearance. She is obese.  HENT:      Head: Normocephalic and atraumatic.  Cardiovascular:     Rate and Rhythm: Normal rate and regular rhythm.     Pulses: Normal pulses.     Heart sounds: Normal heart sounds.  Pulmonary:     Effort: Pulmonary effort is normal.     Breath sounds: Normal breath sounds.  Abdominal:     General: Abdomen is flat. Bowel sounds are normal.     Palpations: Abdomen is soft.  Genitourinary:    Comments: Declines pelvic exam and none done in ER Musculoskeletal:        General: Normal range of motion.     Cervical back: Normal range of motion and neck supple.  Skin:    General: Skin is warm and dry.  Neurological:     General: No focal deficit present.     Mental Status: She is alert and oriented to person, place, and time.  Psychiatric:        Mood and Affect: Mood normal.        Behavior: Behavior normal.     Results for orders placed or performed during the hospital encounter of 04/21/20 (from the past 24 hour(s))  CBC with Differential     Status: Abnormal   Collection Time: 04/21/20  7:12 PM  Result Value Ref Range   WBC 12.0 (H) 4.0 - 10.5 K/uL   RBC 3.41 (L) 3.87 - 5.11 MIL/uL  Hemoglobin 4.5 (LL) 12.0 - 15.0 g/dL   HCT 18.9 (L) 36.0 - 46.0 %   MCV 55.4 (L) 80.0 - 100.0 fL   MCH 13.2 (L) 26.0 - 34.0 pg   MCHC 23.8 (L) 30.0 - 36.0 g/dL   RDW 23.9 (H) 11.5 - 15.5 %   Platelets 781 (H) 150 - 400 K/uL   nRBC 0.9 (H) 0.0 - 0.2 %   Neutrophils Relative % 76 %   Neutro Abs 9.2 (H) 1.7 - 7.7 K/uL   Lymphocytes Relative 16 %   Lymphs Abs 1.9 0.7 - 4.0 K/uL   Monocytes Relative 6 %   Monocytes Absolute 0.8 0.1 - 1.0 K/uL   Eosinophils Relative 0 %   Eosinophils Absolute 0.0 0.0 - 0.5 K/uL   Basophils Relative 1 %   Basophils Absolute 0.1 0.0 - 0.1 K/uL   Immature Granulocytes 1 %   Abs Immature Granulocytes 0.07 0.00 - 0.07 K/uL   Tear Drop Cells PRESENT    Polychromasia PRESENT    Ovalocytes PRESENT   Basic metabolic panel     Status: Abnormal   Collection Time: 04/21/20   7:12 PM  Result Value Ref Range   Sodium 136 135 - 145 mmol/L   Potassium 3.1 (L) 3.5 - 5.1 mmol/L   Chloride 102 98 - 111 mmol/L   CO2 22 22 - 32 mmol/L   Glucose, Bld 241 (H) 70 - 99 mg/dL   BUN 8 8 - 23 mg/dL   Creatinine, Ser 0.64 0.44 - 1.00 mg/dL   Calcium 8.9 8.9 - 10.3 mg/dL   GFR, Estimated >60 >60 mL/min   Anion gap 12 5 - 15  Type and screen     Status: None (Preliminary result)   Collection Time: 04/21/20  7:12 PM  Result Value Ref Range   ABO/RH(D) O POS    Antibody Screen NEG    Sample Expiration 04/24/2020,2359    Unit Number [id number]    Blood Component Type RED CELLS,LR    Unit division 00    Status of Unit ISSUED    Transfusion Status OK TO TRANSFUSE    Crossmatch Result Compatible    Unit Number [id number]    Blood Component Type RED CELLS,LR    Unit division 00    Status of Unit ISSUED,FINAL    Transfusion Status OK TO TRANSFUSE    Crossmatch Result      Compatible Performed at [facility], [place] [address], [place], [place] [address]    Unit Number [id number]    Blood Component Type RED CELLS,LR    Unit division 00    Status of Unit ISSUED    Transfusion Status OK TO TRANSFUSE    Crossmatch Result Compatible   Prepare RBC (crossmatch)     Status: None   Collection Time: 04/21/20  7:12 PM  Result Value Ref Range   Order Confirmation      ORDER PROCESSED BY BLOOD BANK Performed at [facility], [place] [address] [place], [place] [address]   Vitamin B12     Status: None   Collection Time: 04/21/20  7:12 PM  Result Value Ref Range   Vitamin B-12 407 180 - 914 pg/mL  Folate     Status: None   Collection Time: 04/21/20  7:12 PM  Result Value Ref Range   Folate 15.4 >5.9 ng/mL  Iron and TIBC     Status: Abnormal   Collection Time: 04/21/20  7:12 PM  Result  Value Ref Range   Iron 14 (L) 28 - 170 ug/dL   TIBC 617 (H) 250 - 450 ug/dL   Saturation Ratios 2 (L) 10.4 - 31.8 %   UIBC 603 ug/dL  Ferritin      Status: Abnormal   Collection Time: 04/21/20  7:12 PM  Result Value Ref Range   Ferritin 2 (L) 11 - 307 ng/mL  Reticulocytes     Status: Abnormal   Collection Time: 04/21/20  7:12 PM  Result Value Ref Range   Retic Ct Pct 2.0 0.4 - 3.1 %   RBC. 3.43 (L) 3.87 - 5.11 MIL/uL   Retic Count, Absolute 68.3 19.0 - 186.0 K/uL   Immature Retic Fract 20.3 (H) 2.3 - 15.9 %  ABO/Rh     Status: None   Collection Time: 04/21/20  8:45 PM  Result Value Ref Range   ABO/RH(D)      O POS Performed at [facility], [place] [address], [place], [place] [address]   Resp Panel by RT-PCR (Flu A&B, Covid) Nasopharyngeal Swab     Status: None   Collection Time: 04/21/20  8:50 PM   Specimen: Nasopharyngeal Swab; Nasopharyngeal(NP) swabs in vial transport medium  Result Value Ref Range   SARS Coronavirus 2 by RT PCR NEGATIVE NEGATIVE   Influenza A by PCR NEGATIVE NEGATIVE   Influenza B by PCR NEGATIVE NEGATIVE    No results found.  Assessment/Plan: PMB with acute AUB and secondary anemia- known fibroids. S/P 3U PRBCs Declines pelvic exam and declines surgery Will rpt CBC.  TAAVON,RICHARD J 04/22/2020, 9:38 AM

## 2020-04-22 NOTE — ED Notes (Signed)
Report to 6N RN, Caryl Pina with no further questions at this time.

## 2020-04-22 NOTE — ED Notes (Signed)
Pt continues to endorse changing pads every 1-2 hours.

## 2020-04-22 NOTE — ED Notes (Signed)
[facility] called for transport. 

## 2020-04-22 NOTE — ED Notes (Signed)
This RN spoke with bed placement, states patient is to go to 6N or 5N at [facility] but there are no beds at this time so patient will be holding overnight.

## 2020-04-23 DIAGNOSIS — D509 Iron deficiency anemia, unspecified: Secondary | ICD-10-CM | POA: Diagnosis not present

## 2020-04-23 DIAGNOSIS — N950 Postmenopausal bleeding: Secondary | ICD-10-CM | POA: Diagnosis not present

## 2020-04-23 LAB — BPAM RBC
Blood Product Expiration Date: 202204022359
Blood Product Expiration Date: 202204022359
Blood Product Expiration Date: 202204022359
Blood Product Expiration Date: 202204072359
ISSUE DATE / TIME: 202203072323
ISSUE DATE / TIME: 202203080152
ISSUE DATE / TIME: 202203080455
ISSUE DATE / TIME: 202203082027
Unit Type and Rh: 5100
Unit Type and Rh: 5100
Unit Type and Rh: 5100
Unit Type and Rh: 5100

## 2020-04-23 LAB — TYPE AND SCREEN
ABO/RH(D): O POS
ABO/RH(D): O POS
Antibody Screen: NEGATIVE
Antibody Screen: NEGATIVE
Unit division: 0
Unit division: 0
Unit division: 0
Unit division: 0

## 2020-04-23 LAB — CBC WITH DIFFERENTIAL/PLATELET
Abs Immature Granulocytes: 0.05 10*3/uL (ref 0.00–0.07)
Basophils Absolute: 0.1 10*3/uL (ref 0.0–0.1)
Basophils Relative: 1 %
Eosinophils Absolute: 0.1 10*3/uL (ref 0.0–0.5)
Eosinophils Relative: 0 %
HCT: 28.1 % — ABNORMAL LOW (ref 36.0–46.0)
Hemoglobin: 8.9 g/dL — ABNORMAL LOW (ref 12.0–15.0)
Immature Granulocytes: 0 %
Lymphocytes Relative: 25 %
Lymphs Abs: 3.2 10*3/uL (ref 0.7–4.0)
MCH: 21.4 pg — ABNORMAL LOW (ref 26.0–34.0)
MCHC: 31.7 g/dL (ref 30.0–36.0)
MCV: 67.7 fL — ABNORMAL LOW (ref 80.0–100.0)
Monocytes Absolute: 1 10*3/uL (ref 0.1–1.0)
Monocytes Relative: 8 %
Neutro Abs: 8.1 10*3/uL — ABNORMAL HIGH (ref 1.7–7.7)
Neutrophils Relative %: 66 %
Platelets: 379 10*3/uL (ref 150–400)
RBC: 4.15 MIL/uL (ref 3.87–5.11)
WBC: 12.4 10*3/uL — ABNORMAL HIGH (ref 4.0–10.5)
nRBC: 0.5 % — ABNORMAL HIGH (ref 0.0–0.2)

## 2020-04-23 MED ORDER — NORETHINDRONE ACETATE 5 MG PO TABS: 5.0000 mg | ORAL_TABLET | Freq: Three times a day (TID) | ORAL | 0 refills | Status: DC

## 2020-04-23 MED ORDER — IBUPROFEN 800 MG PO TABS: 800.0000 mg | ORAL_TABLET | Freq: Three times a day (TID) | ORAL | 0 refills | Status: DC | PRN

## 2020-04-23 NOTE — Discharge Summary (Signed)
Physician Discharge Summary  Patient ID: Lindsay Carroll MRN: [id number] DOB/AGE: 1956/01/24 65 y.o.  Admit date: 04/21/2020 Discharge date: 04/23/2020  Admission Diagnoses: 1. Postmenopausal vaginal bleeding 2. Acute on chronic blood loss anemia  Discharge Diagnoses:  Active Problems:   Vaginal bleeding   Discharged Condition: good  Hospital Course: 3/7 Patient presented to [facility] ER with heavy VB, abdominal pain, and fatigue. Patient was found to have critical Hgb level of 4.5. She was transfused 3U of PRBCs and transferred to [facility] for inpatient management.  3/8 HD1 Patient arrived to [facility]. Started on Norethindrone 5 mg TID for acute VB management. VB found to be moderate flow but stable. Patient vitals remained stable and post-transfusion Hgb was 7.9. She was transfused 1 additional unit of PRBCs in the evening. Diet advanced. 3/9 HD2 Patient doing well this morning. Extreme fatigue has greatly improved. No dizziness. Ambulating without difficulties. Denies CP or SOB. VB still present but slowed since presentation. Pad this morning from past 4 hours is 1/3 saturated, no large clots. Abdominal pain improved with Ibuprofen and Percocet. AM Hgb stable at 8.9. Feels ready to go home.   Consults: None  Significant Diagnostic Studies: labs:  CBC Latest Ref Rng & Units 04/23/2020 04/22/2020 04/21/2020  WBC 4.0 - 10.5 K/uL 12.4(H) 13.2(H) 12.0(H)  Hemoglobin 12.0 - 15.0 g/dL 8.9(L) 7.6(L) 4.5(LL)  Hematocrit 36.0 - 46.0 % 28.1(L) 24.5(L) 18.9(L)  Platelets 150 - 400 K/uL 379 501(H) 781(H)    Treatments: Transfusion of blood products: received 4U PRBCs  Discharge Exam: Blood pressure 136/64, pulse 86, temperature 98.2 F (36.8 C), resp. rate 18, height 5\' 4"  (1.626 m), weight 88 kg, SpO2 100 %. General appearance: alert, cooperative and no distress Head: Normocephalic, without obvious abnormality, atraumatic Resp: clear to auscultation bilaterally Cardio: regular rate and  rhythm, S1, S2 normal, no murmur, click, rub or gallop Pelvic: minimal bleeding on pad, patient declines full pelvic exam Extremities: Homans sign is negative, no sign of DVT  Disposition: Discharge disposition: 01-Home or Self Care       Discharge Instructions    Call MD for:  difficulty breathing, headache or visual disturbances   Complete by: As directed    Call MD for:  extreme fatigue   Complete by: As directed    Call MD for:  persistant dizziness or light-headedness   Complete by: As directed    Call MD for:  persistant nausea and vomiting   Complete by: As directed    Call MD for:  severe uncontrolled pain   Complete by: As directed    Call MD for:  temperature >100.4   Complete by: As directed    Diet - low sodium heart healthy   Complete by: As directed    Discharge instructions   Complete by: As directed    Activity as tolerated, no restrictions. Regular diet. Follow up in office as scheduled 04/25/20 at [facility] Patient advised to call office with any persistent heavy vaginal bleeding: saturating >1 large pad in <1 hour x 2 consecutive hours   Increase activity slowly   Complete by: As directed      Allergies as of 04/23/2020      Reactions   Apple Hives   Pectin       Medication List    STOP taking these medications   conjugated estrogens vaginal cream Commonly known as: Premarin   naproxen sodium 220 MG tablet Commonly known as: ALEVE     TAKE  these medications   acetaminophen 500 MG tablet Commonly known as: TYLENOL Take 500 mg by mouth 2 (two) times daily as needed for moderate pain.   ibuprofen 800 MG tablet Commonly known as: ADVIL Take 1 tablet (800 mg total) by mouth every 8 (eight) hours as needed. Sent via office EMR   norethindrone 5 MG tablet Commonly known as: AYGESTIN Take 1 tablet (5 mg total) by mouth every 8 (eight) hours. Sent via office EMR What changed:   when to take this  additional instructions      Follow  up scheduled in office at [facility] on Friday April 25, 2020 Scheduled for outpatient surgery at [facility] with diagnostic hysteroscopy on Wednesday March 16  Discharge instructions reviewed with patient   Signed: Cassandra A Law 04/23/2020, 8:03 AM

## 2020-04-24 ENCOUNTER — Encounter (HOSPITAL_BASED_OUTPATIENT_CLINIC_OR_DEPARTMENT_OTHER): Payer: Self-pay | Admitting: Obstetrics and Gynecology

## 2020-04-28 ENCOUNTER — Other Ambulatory Visit (HOSPITAL_COMMUNITY)
Admission: RE | Admit: 2020-04-28 | Discharge: 2020-04-28 | Disposition: A | Discharge status: Home / Self Care | Payer: BC Managed Care – PPO | Source: Ambulatory Visit | Attending: Obstetrics and Gynecology | Admitting: Obstetrics and Gynecology

## 2020-04-28 ENCOUNTER — Encounter (HOSPITAL_BASED_OUTPATIENT_CLINIC_OR_DEPARTMENT_OTHER): Payer: Self-pay | Admitting: Obstetrics and Gynecology

## 2020-04-28 ENCOUNTER — Other Ambulatory Visit: Payer: Self-pay

## 2020-04-28 DIAGNOSIS — Z20822 Contact with and (suspected) exposure to covid-19: Secondary | ICD-10-CM | POA: Insufficient documentation

## 2020-04-28 DIAGNOSIS — Z800 Family history of malignant neoplasm of digestive organs: Secondary | ICD-10-CM | POA: Diagnosis not present

## 2020-04-28 DIAGNOSIS — D250 Submucous leiomyoma of uterus: Secondary | ICD-10-CM | POA: Diagnosis not present

## 2020-04-28 DIAGNOSIS — N950 Postmenopausal bleeding: Secondary | ICD-10-CM | POA: Diagnosis not present

## 2020-04-28 DIAGNOSIS — Z01812 Encounter for preprocedural laboratory examination: Secondary | ICD-10-CM | POA: Insufficient documentation

## 2020-04-28 DIAGNOSIS — Z79899 Other long term (current) drug therapy: Secondary | ICD-10-CM | POA: Diagnosis not present

## 2020-04-28 DIAGNOSIS — Z91018 Allergy to other foods: Secondary | ICD-10-CM | POA: Diagnosis not present

## 2020-04-28 NOTE — Progress Notes (Signed)
Spoke w/ via phone for pre-op interview--- PT Lab needs dos----  CBC, T&S             Lab results------ previous CBCdiff done 04-23-2020 result in epic COVID test ------ done 04-28-2020 result in epic Arrive at ------- 0630 on 04-30-2020 NPO after MN NO Solid Food.  Clear liquids from MN until--- 0530 Med rec completed Medications to take morning of surgery ----- Norethindrone Diabetic medication ----- n/a Patient instructed to bring photo id and insurance card day of surgery Patient aware to have Driver (ride ) / caregiver    for 24 hours after surgery -- Sister Patient Special Instructions ----- n/a Pre-Op special Istructions ----- n/a Patient verbalized understanding of instructions that were given at this phone interview. Patient denies shortness of breath, chest pain, fever, cough at this phone interview.

## 2020-04-29 LAB — SARS CORONAVIRUS 2 (TAT 6-24 HRS): SARS Coronavirus 2: NEGATIVE

## 2020-04-29 NOTE — Anesthesia Preprocedure Evaluation (Addendum)
Anesthesia Evaluation  Patient identified by MRN, date of birth, ID band Patient awake    Reviewed: Allergy & Precautions, NPO status , Patient's Chart, lab work & pertinent test results  History of Anesthesia Complications Negative for: history of anesthetic complications  Airway Mallampati: III  TM Distance: >3 FB Neck ROM: Full    Dental  (+) Partial Upper   Pulmonary neg pulmonary ROS,    Pulmonary exam normal        Cardiovascular negative cardio ROS Normal cardiovascular exam     Neuro/Psych negative neurological ROS  negative psych ROS   GI/Hepatic negative GI ROS, Neg liver ROS,   Endo/Other   K 3.1 Obesity   Renal/GU negative Renal ROS     Musculoskeletal negative musculoskeletal ROS (+)   Abdominal   Peds  Hematology  (+) anemia ,  INR 1.3    Anesthesia Other Findings Covid test negative   Reproductive/Obstetrics  Fibroids                             Anesthesia Physical Anesthesia Plan  ASA: II  Anesthesia Plan: General   Post-op Pain Management:    Induction: Intravenous  PONV Risk Score and Plan: 3 and Treatment may vary due to age or medical condition, Ondansetron, Dexamethasone and Midazolam  Airway Management Planned: LMA  Additional Equipment: None  Intra-op Plan:   Post-operative Plan: Extubation in OR  Informed Consent: I have reviewed the patients History and Physical, chart, labs and discussed the procedure including the risks, benefits and alternatives for the proposed anesthesia with the patient or authorized representative who has indicated his/her understanding and acceptance.     Dental advisory given  Plan Discussed with: CRNA and Anesthesiologist  Anesthesia Plan Comments:        Anesthesia Quick Evaluation

## 2020-04-30 ENCOUNTER — Other Ambulatory Visit: Payer: Self-pay

## 2020-04-30 ENCOUNTER — Encounter (HOSPITAL_BASED_OUTPATIENT_CLINIC_OR_DEPARTMENT_OTHER)
Admission: RE | Disposition: A | Discharge status: Home / Self Care | Payer: Self-pay | Source: Home / Self Care | Attending: Obstetrics and Gynecology

## 2020-04-30 ENCOUNTER — Ambulatory Visit (HOSPITAL_BASED_OUTPATIENT_CLINIC_OR_DEPARTMENT_OTHER)
Admission: RE | Admit: 2020-04-30 | Discharge: 2020-04-30 | Disposition: A | Discharge status: Home / Self Care | Payer: BC Managed Care – PPO | Attending: Obstetrics and Gynecology | Admitting: Obstetrics and Gynecology

## 2020-04-30 ENCOUNTER — Ambulatory Visit (HOSPITAL_BASED_OUTPATIENT_CLINIC_OR_DEPARTMENT_OTHER): Payer: BC Managed Care – PPO | Admitting: Anesthesiology

## 2020-04-30 ENCOUNTER — Encounter (HOSPITAL_BASED_OUTPATIENT_CLINIC_OR_DEPARTMENT_OTHER): Payer: Self-pay | Admitting: Obstetrics and Gynecology

## 2020-04-30 DIAGNOSIS — D250 Submucous leiomyoma of uterus: Secondary | ICD-10-CM | POA: Diagnosis not present

## 2020-04-30 DIAGNOSIS — Z79899 Other long term (current) drug therapy: Secondary | ICD-10-CM | POA: Insufficient documentation

## 2020-04-30 DIAGNOSIS — N950 Postmenopausal bleeding: Secondary | ICD-10-CM | POA: Diagnosis not present

## 2020-04-30 DIAGNOSIS — Z20822 Contact with and (suspected) exposure to covid-19: Secondary | ICD-10-CM | POA: Insufficient documentation

## 2020-04-30 DIAGNOSIS — Z91018 Allergy to other foods: Secondary | ICD-10-CM | POA: Diagnosis not present

## 2020-04-30 DIAGNOSIS — D500 Iron deficiency anemia secondary to blood loss (chronic): Secondary | ICD-10-CM | POA: Diagnosis not present

## 2020-04-30 DIAGNOSIS — Z800 Family history of malignant neoplasm of digestive organs: Secondary | ICD-10-CM | POA: Diagnosis not present

## 2020-04-30 HISTORY — DX: Presence of dental prosthetic device (complete) (partial): Z97.2

## 2020-04-30 HISTORY — DX: Submucous leiomyoma of uterus: D25.0

## 2020-04-30 HISTORY — DX: Iron deficiency anemia secondary to blood loss (chronic): D50.0

## 2020-04-30 HISTORY — DX: Postmenopausal bleeding: N95.0

## 2020-04-30 HISTORY — PX: DILATATION & CURETTAGE/HYSTEROSCOPY WITH MYOSURE: SHX6511

## 2020-04-30 LAB — TYPE AND SCREEN
ABO/RH(D): O POS
Antibody Screen: NEGATIVE

## 2020-04-30 LAB — CBC
HCT: 30.5 % — ABNORMAL LOW (ref 36.0–46.0)
Hemoglobin: 8.8 g/dL — ABNORMAL LOW (ref 12.0–15.0)
MCH: 20.3 pg — ABNORMAL LOW (ref 26.0–34.0)
MCHC: 28.9 g/dL — ABNORMAL LOW (ref 30.0–36.0)
MCV: 70.4 fL — ABNORMAL LOW (ref 80.0–100.0)
Platelets: 346 10*3/uL (ref 150–400)
RBC: 4.33 MIL/uL (ref 3.87–5.11)
WBC: 11.4 10*3/uL — ABNORMAL HIGH (ref 4.0–10.5)
nRBC: 0 % (ref 0.0–0.2)

## 2020-04-30 SURGERY — DILATATION & CURETTAGE/HYSTEROSCOPY WITH MYOSURE
Anesthesia: General | Site: Vagina

## 2020-04-30 MED ORDER — MIDAZOLAM HCL 2 MG/2ML IJ SOLN
INTRAMUSCULAR | Status: AC
  Filled 2020-04-30: qty 2

## 2020-04-30 MED ORDER — OXYCODONE HCL 5 MG PO TABS
ORAL_TABLET | ORAL | Status: AC
  Filled 2020-04-30: qty 1

## 2020-04-30 MED ORDER — LIDOCAINE 2% (20 MG/ML) 5 ML SYRINGE
INTRAMUSCULAR | Status: AC
  Filled 2020-04-30: qty 5

## 2020-04-30 MED ORDER — FENTANYL CITRATE (PF) 100 MCG/2ML IJ SOLN
INTRAMUSCULAR | Status: AC
  Filled 2020-04-30: qty 2

## 2020-04-30 MED ORDER — PROPOFOL 10 MG/ML IV BOLUS
INTRAVENOUS | Status: AC
  Filled 2020-04-30: qty 20

## 2020-04-30 MED ORDER — DEXAMETHASONE SODIUM PHOSPHATE 10 MG/ML IJ SOLN
INTRAMUSCULAR | Status: AC
  Filled 2020-04-30: qty 1

## 2020-04-30 MED ORDER — FENTANYL CITRATE (PF) 100 MCG/2ML IJ SOLN
25.0000 ug | INTRAMUSCULAR | Status: DC | PRN
  Administered 2020-04-30 (×2): 50 ug via INTRAVENOUS

## 2020-04-30 MED ORDER — ONDANSETRON HCL 4 MG/2ML IJ SOLN
INTRAMUSCULAR | Status: DC | PRN
  Administered 2020-04-30: 4 mg via INTRAVENOUS

## 2020-04-30 MED ORDER — PROPOFOL 10 MG/ML IV BOLUS
INTRAVENOUS | Status: DC | PRN
  Administered 2020-04-30: 150 mg via INTRAVENOUS

## 2020-04-30 MED ORDER — DEXAMETHASONE SODIUM PHOSPHATE 10 MG/ML IJ SOLN
INTRAMUSCULAR | Status: DC | PRN
  Administered 2020-04-30: 5 mg via INTRAVENOUS

## 2020-04-30 MED ORDER — MIDAZOLAM HCL 5 MG/5ML IJ SOLN
INTRAMUSCULAR | Status: DC | PRN
  Administered 2020-04-30: 2 mg via INTRAVENOUS

## 2020-04-30 MED ORDER — FENTANYL CITRATE (PF) 100 MCG/2ML IJ SOLN
INTRAMUSCULAR | Status: DC | PRN
  Administered 2020-04-30 (×4): 25 ug via INTRAVENOUS

## 2020-04-30 MED ORDER — ONDANSETRON HCL 4 MG/2ML IJ SOLN
INTRAMUSCULAR | Status: AC
  Filled 2020-04-30: qty 2

## 2020-04-30 MED ORDER — SODIUM CHLORIDE 0.9 % IR SOLN
Status: DC | PRN
  Administered 2020-04-30 (×3): 3000 mL

## 2020-04-30 MED ORDER — LIDOCAINE 2% (20 MG/ML) 5 ML SYRINGE
INTRAMUSCULAR | Status: DC | PRN
  Administered 2020-04-30: 60 mg via INTRAVENOUS

## 2020-04-30 MED ORDER — POVIDONE-IODINE 10 % EX SWAB: 2.0000 "application " | Freq: Once | CUTANEOUS | Status: DC

## 2020-04-30 MED ORDER — LACTATED RINGERS IV SOLN: INTRAVENOUS | Status: DC

## 2020-04-30 MED ORDER — OXYCODONE HCL 5 MG PO TABS
5.0000 mg | ORAL_TABLET | Freq: Once | ORAL | Status: AC | PRN
  Administered 2020-04-30: 5 mg via ORAL

## 2020-04-30 MED ORDER — PROMETHAZINE HCL 25 MG/ML IJ SOLN: 6.2500 mg | INTRAMUSCULAR | Status: DC | PRN

## 2020-04-30 MED ORDER — OXYCODONE HCL 5 MG/5ML PO SOLN: 5.0000 mg | Freq: Once | ORAL | Status: AC | PRN

## 2020-04-30 SURGICAL SUPPLY — 21 items
CANISTER SUCT 3000ML PPV (MISCELLANEOUS) ×2 IMPLANT
CATH ROBINSON RED A/P 16FR (CATHETERS) ×4 IMPLANT
DEVICE MYOSURE LITE (MISCELLANEOUS) IMPLANT
DEVICE MYOSURE REACH (MISCELLANEOUS) IMPLANT
GLOVE SURG ENC MOIS LTX SZ6.5 (GLOVE) ×4 IMPLANT
GLOVE SURG LTX SZ6.5 (GLOVE) ×2 IMPLANT
GLOVE SURG UNDER POLY LF SZ6.5 (GLOVE) ×4 IMPLANT
GLOVE SURG UNDER POLY LF SZ7 (GLOVE) ×6 IMPLANT
GOWN STRL REUS W/ TWL LRG LVL3 (GOWN DISPOSABLE) ×2 IMPLANT
GOWN STRL REUS W/TWL LRG LVL3 (GOWN DISPOSABLE) ×6 IMPLANT
IV NS IRRIG 3000ML ARTHROMATIC (IV SOLUTION) ×6 IMPLANT
KIT PROCEDURE FLUENT (KITS) ×2 IMPLANT
KIT TURNOVER CYSTO (KITS) ×2 IMPLANT
MYOSURE XL FIBROID (MISCELLANEOUS) ×2
PACK VAGINAL MINOR WOMEN LF (CUSTOM PROCEDURE TRAY) ×2 IMPLANT
PAD OB MATERNITY 4.3X12.25 (PERSONAL CARE ITEMS) ×2 IMPLANT
SEAL CERVICAL OMNI LOK (ABLATOR) ×2 IMPLANT
SEAL ROD LENS SCOPE MYOSURE (ABLATOR) ×2 IMPLANT
SYSTEM TISS REMOVAL MYOSURE XL (MISCELLANEOUS) ×1 IMPLANT
TOWEL OR 17X26 10 PK STRL BLUE (TOWEL DISPOSABLE) ×2 IMPLANT
UNDERPAD 30X36 HEAVY ABSORB (UNDERPADS AND DIAPERS) ×2 IMPLANT

## 2020-04-30 NOTE — Anesthesia Procedure Notes (Signed)
Procedure Name: LMA Insertion Date/Time: 04/30/2020 8:32 AM Performed by: Lollie Sails, CRNA Pre-anesthesia Checklist: Patient identified, Emergency Drugs available, Suction available, Patient being monitored and Timeout performed Patient Re-evaluated:Patient Re-evaluated prior to induction Oxygen Delivery Method: Circle system utilized Preoxygenation: Pre-oxygenation with 100% oxygen Induction Type: IV induction Ventilation: Mask ventilation without difficulty LMA: LMA inserted LMA Size: 4.0 Number of attempts: 1 Placement Confirmation: positive ETCO2 and breath sounds checked- equal and bilateral Tube secured with: Tape Dental Injury: Teeth and Oropharynx as per pre-operative assessment

## 2020-04-30 NOTE — Anesthesia Postprocedure Evaluation (Signed)
Anesthesia Post Note  Patient: Ardeth Sportsman  Procedure(s) Performed: DILATATION & CURETTAGE/HYSTEROSCOPY WITH MYOSURE (N/A Vagina )     Patient location during evaluation: PACU Anesthesia Type: General Level of consciousness: awake and alert Pain management: pain level controlled Vital Signs Assessment: post-procedure vital signs reviewed and stable Respiratory status: spontaneous breathing, nonlabored ventilation and respiratory function stable Cardiovascular status: blood pressure returned to baseline and stable Postop Assessment: no apparent nausea or vomiting Anesthetic complications: no   No complications documented.  Last Vitals:  Vitals:   04/30/20 1000 04/30/20 1015  BP: 135/71 132/76  Pulse: 77 73  Resp: 17 16  Temp:    SpO2: 100% 97%    Last Pain:  Vitals:   04/30/20 1015  TempSrc:   PainSc: [place]

## 2020-04-30 NOTE — H&P (Signed)
Lindsay Carroll is an 65 y.o. female.   34Y P46 menopausal female presents for scheduled diagnostic hysteroscopy D&C with possible myomectomy  Patient presented to the ER last week Mon 3/7 with heavy VB, weakness, and fatigue and was found to have a critical Hgb of 4.5. She was admitted for blood product transfusion. She received a total of 4U PRBCs and Hgb raised appropriately to 8.9 prior to DC home on 3/9. She was started on Aygestin 5mg  TID which slowed VB, although patient refuses pelvic exams, so difficult to quantify.   Patient seen previously in office for PMB. Had refused pelvic exams then, unable to obtain in office EBx and was counseled on importance of tissue biopsy due to concerns for uterine malignancy. In office transabdominal US on 2/2 was significant for 7cm fibroid appearing mass. Hysteroscopy request made but patient was non compliant with scheduling at that time.  Today, patient reports continued VB on the Aygestin, but much lighter than it was previously. Denies any dizziness, syncope, CP, palpitations, or SOB.  Patient denies any known medical problems and does not take any other medications, although she has been to a PCP in many years.     Menstrual History: No LMP recorded. Patient is postmenopausal.    Past Medical History:  Diagnosis Date  . Anemia due to blood loss, chronic    hx anemia but had hospital admission 04-21-2020 for heavy vaginal bleeding , abd. pain, fatigue, Hg 4.5;  pt transfused 3 units PRBCs, pt discharged 04-23-2020 Hg 8.9;  pt is scheduled for hysteroscopy with myosure 04-30-2020 by Dr Lanny Cramp (gyn)  . PMB (postmenopausal bleeding)    chronic  . Submucous uterine fibroid   . Wears partial dentures    upper only    Past Surgical History:  Procedure Laterality Date  . NO PAST SURGERIES      Family History  Problem Relation Age of Onset  . Cancer Mother 2       colon  . Cancer Father 26       colon    Social History:  reports that  she has never smoked. She has never used smokeless tobacco. She reports previous alcohol use. She reports previous drug use.  Allergies:  Allergies  Allergen Reactions  . Apple Anaphylaxis and Hives  . Pectin     Medications Prior to Admission  Medication Sig Dispense Refill Last Dose  . acetaminophen (TYLENOL) 500 MG tablet Take 500 mg by mouth 2 (two) times daily as needed for moderate pain.   Past Month at Unknown time  . ibuprofen (ADVIL) 800 MG tablet Take 1 tablet (800 mg total) by mouth every 8 (eight) hours as needed. Sent via office EMR 30 tablet 0 Past Month at Unknown time  . norethindrone (AYGESTIN) 5 MG tablet Take 1 tablet (5 mg total) by mouth every 8 (eight) hours. Sent via office EMR 30 tablet 0 04/30/2020 at Unknown time  . oxyCODONE-acetaminophen (PERCOCET/ROXICET) 5-325 MG tablet Take by mouth every 4 (four) hours as needed for severe pain.   Past Week at Unknown time    Review of Systems  All other systems reviewed and are negative.   Blood pressure (!) 146/82, pulse 95, temperature 99.2 F (37.3 C), temperature source Oral, resp. rate 18, height 5\' 4"  (1.626 m), weight 91.6 kg, SpO2 100 %. Physical Exam Vitals reviewed.  Constitutional:      Appearance: Normal appearance.  HENT:     Head: Normocephalic.  Cardiovascular:  Rate and Rhythm: Normal rate.  Pulmonary:     Effort: Pulmonary effort is normal.  Abdominal:     Palpations: Abdomen is soft.  Musculoskeletal:        General: Normal range of motion.     Cervical back: Normal range of motion.  Skin:    General: Skin is warm and dry.  Neurological:     General: No focal deficit present.     Mental Status: She is alert and oriented to person, place, and time.  Psychiatric:        Mood and Affect: Mood normal.        Behavior: Behavior normal.     Results for orders placed or performed during the hospital encounter of 04/30/20 (from the past 24 hour(s))  Type and screen     Status: None  (Preliminary result)   Collection Time: 04/30/20  6:48 AM  Result Value Ref Range   ABO/RH(D) O POS    Antibody Screen PENDING    Sample Expiration      05/03/2020,2359 Performed at [facility], [place] [address], [place], [place] [address]   CBC     Status: Abnormal   Collection Time: 04/30/20  6:48 AM  Result Value Ref Range   WBC 11.4 (H) 4.0 - 10.5 K/uL   RBC 4.33 3.87 - 5.11 MIL/uL   Hemoglobin 8.8 (L) 12.0 - 15.0 g/dL   HCT 30.5 (L) 36.0 - 46.0 %   MCV 70.4 (L) 80.0 - 100.0 fL   MCH 20.3 (L) 26.0 - 34.0 pg   MCHC 28.9 (L) 30.0 - 36.0 g/dL   RDW Not Measured 11.5 - 15.5 %   Platelets 346 150 - 400 K/uL   nRBC 0.0 0.0 - 0.2 %    Assessment/Plan:  64Y P0020 postmenopausal female scheduled for diagnostic hysteroscopy with directed biopsies, D&C, and possible myomectomy for PMB   -Patient consented for the above procedure including review of risks being bleeding, infection, and damage to surrounding organs. She is agreeable to blood products in the event of excessive blood loss. Hgb stable this morning at 8.8 with chronic blood loss anemia. All questions answered and consents signed -No preop antibiotics indicated -SCD VTE ppx -Routine preop/postop care -Anticipate DC home today. DC instructions reviewed with patient and scheduled for 2 week postop visit in the office on 05/14/20  Cassandra A Law 04/30/2020, 8:20 AM

## 2020-04-30 NOTE — Transfer of Care (Signed)
Immediate Anesthesia Transfer of Care Note  Patient: Lindsay Carroll  Procedure(s) Performed: DILATATION & CURETTAGE/HYSTEROSCOPY WITH MYOSURE (N/A Vagina )  Patient Location: PACU  Anesthesia Type:General  Level of Consciousness: awake, alert  and patient cooperative  Airway & Oxygen Therapy: Patient Spontanous Breathing and Patient connected to nasal cannula oxygen  Post-op Assessment: Report given to RN and Post -op Vital signs reviewed and stable  Post vital signs: Reviewed and stable  Last Vitals:  Vitals Value Taken Time  BP 139/71 04/30/20 0936  Temp    Pulse 76 04/30/20 0937  Resp 17 04/30/20 0937  SpO2 100 % 04/30/20 0937  Vitals shown include unvalidated device data.  Last Pain:  Vitals:   04/30/20 0658  TempSrc: Oral  PainSc: 0-No pain      Patients Stated Pain Goal: 3 (21/03/12 8118)  Complications: No complications documented.

## 2020-04-30 NOTE — Op Note (Signed)
EWELINA NAVES 1955/10/26 [id number]   04/30/20  Operative Note  PROCEDURE: operative hysteroscopy, directed tissue biopsy, dilation and curettage  PRE-OPERATIVE DIAGNOSIS: PMB, uterine fibroid  POST-OPERATIVE DIAGNOSIS: PMB, sub-mucosal fibroid  SURGEON: Dr. Langley Gauss, DO  ASSISTANT: N/A  FINDINGS: atrophic vaginal epithelium, normal appearing ectocervix without lesions, normal cervical os dilated without difficulties, uterine cavity obscured by large 6-7 cm intracavitary sub-submucosal fibroid arising from the right uterine sidewall significantly calcified   SPECIMENS: portion of fibroid, endometrial curettings  EBL: minimal  FLUIDS: Deficit 380 mL, IV crystalloids 600 cc  UOP: 25 cc clear  COMPLICATIONS: None  PROCEDURE IN DETAIL:   After the patient was appropriately consented in the holding area, she was taken to the operating room where general anesthesia was administered without complications. The patient was placed in the dorsal lithotomy position. The patient was prepped and draped in the usual sterile fashion. The bladder was trained with a catheter. An appropriate time out was performed that verified the correct patient, procedure, and surgical team.   A sterile speculum was inserted into the vagina and the cervix was visualized. A single tooth tenaculum was used to grasp the anterior lip of the cervix. The cervix was sequentially dilated up to a size 19 french. The hysteroscopy was introduced through the cervix and advanced to the uterine fundus. Visualization was limited initially, and excess fluid loss from the cervix was noted. The Omni-Lok was utilized to secure the seal and visualization improved. The uterine cavity distension was obtained and the findings were noted as described above. The MyoSure 'XL' was utilized to resect part of the fibroid under direct visualization. However given the large size and significant calcification of the fibroid, complete  resection was unlikely to be successful and directed biopsies were obtained. The MyoSure was also utilized to obtain directed endometrial sampling along the anterior and posterior walls. The hysteroscope was removed. A gthorough sharp curetting was performed and specimens collected on a Telfa. All specimens were sent for final pathology. The tenaculum was removed and excellent hemostasis was appreciated. Speculum was removed. Patient tolerated the procedure well and was taken to the recovery room in stable condition. All instrument and lap counts were correct.  Kenisha Lynds A Clarinda Obi 04/30/20 9:50 AM

## 2020-04-30 NOTE — Discharge Instructions (Signed)

## 2020-05-01 ENCOUNTER — Encounter (HOSPITAL_BASED_OUTPATIENT_CLINIC_OR_DEPARTMENT_OTHER): Payer: Self-pay | Admitting: Obstetrics and Gynecology

## 2020-05-01 LAB — SURGICAL PATHOLOGY

## 2020-05-16 ENCOUNTER — Encounter (HOSPITAL_BASED_OUTPATIENT_CLINIC_OR_DEPARTMENT_OTHER): Payer: Self-pay | Admitting: Obstetrics and Gynecology

## 2020-05-27 ENCOUNTER — Encounter: Payer: Self-pay | Admitting: Obstetrics and Gynecology

## 2020-05-27 ENCOUNTER — Other Ambulatory Visit: Payer: Self-pay

## 2020-05-27 ENCOUNTER — Ambulatory Visit (INDEPENDENT_AMBULATORY_CARE_PROVIDER_SITE_OTHER): Payer: BC Managed Care – PPO | Admitting: Obstetrics and Gynecology

## 2020-05-27 VITALS — BP 122/85 | HR 91 | Ht 64.0 in | Wt 198.0 lb

## 2020-05-27 DIAGNOSIS — N950 Postmenopausal bleeding: Secondary | ICD-10-CM

## 2020-05-27 DIAGNOSIS — Z1231 Encounter for screening mammogram for malignant neoplasm of breast: Secondary | ICD-10-CM | POA: Diagnosis not present

## 2020-05-27 MED ORDER — MEGESTROL ACETATE 40 MG PO TABS: 40.0000 mg | ORAL_TABLET | Freq: Two times a day (BID) | ORAL | 5 refills | Status: DC

## 2020-05-27 NOTE — Progress Notes (Signed)
Pt is here to discuss hysterectomy. She recently had hysteroscopy d&c with myosure. Pt states she has concerns about your recent surgery. Pt is still bleeding. She is still having pain. 1

## 2020-05-27 NOTE — Progress Notes (Signed)
65 yo Lindsay Carroll here for desire for hysterectomy. Patient has been postmenopausal for several years. She reports new onset of vaginal bleeding in 2020 and was seen by Dr. Glo Herring. Patient failed to follow up for further evaluation due to the pandemic. She reports intermittent vaginal bleeding since 2020. Most recently in early March she bled so much that she became anemic and required a blood transfusion. Patient underwent a D&C, hysteroscopy with myosure with Dr. Lanny Cramp on 04/30/20. Patient is dissatisfied with the results of the procedure and no longer trusts Dr. Lanny Cramp to perform a hysterectomy as she feels that the problematic fibroid was not successfully removed with the Myosure. Patient reports intermittent vaginal bleeding at times heavy with clots and other days consisting of vaginal spotting. Patient report 6/10 cramping pain not relieved by ibuprofen. Pain is worst with vaginal bleeding.   Past Medical History:  Diagnosis Date  . Anemia due to blood loss, chronic    hx anemia but had hospital admission 04-21-2020 for heavy vaginal bleeding , abd. pain, fatigue, Hg 4.5;  pt transfused 3 units PRBCs, pt discharged 04-23-2020 Hg 8.9;  pt is scheduled for hysteroscopy with myosure 04-30-2020 by Dr Lanny Cramp (gyn)  . PMB (postmenopausal bleeding)    chronic  . Submucous uterine fibroid   . Wears partial dentures    upper only   Past Surgical History:  Procedure Laterality Date  . DILATATION & CURETTAGE/HYSTEROSCOPY WITH MYOSURE N/A 04/30/2020   Procedure: DILATATION & CURETTAGE/HYSTEROSCOPY WITH MYOSURE;  Surgeon: Armandina Stammer, DO;  Location: [facility];  Service: Gynecology;  Laterality: N/A;  . NO PAST SURGERIES     Family History  Problem Relation Age of Onset  . Cancer Mother 58       colon  . Cancer Father 41       colon   Social History   Tobacco Use  . Smoking status: Never Smoker  . Smokeless tobacco: Never Used  Vaping Use  . Vaping Use: Never used  Substance Use  Topics  . Alcohol use: Not Currently    Comment: 04-28-2020  per pt last quit alcohol 1998  . Drug use: Not Currently    Comment: 04-28-2020  per pt hx drug use, last used 1996   ROS See pertinent in HPI. All other systems reviewed and non contributory Blood pressure 122/85, pulse 91, height 5\' 4"  (1.626 m), weight 198 lb (89.8 kg). GENERAL: Well-developed, well-nourished female in no acute distress.  NEURO: alert and oriented x 3 Patient declined pelvic exam  A/P 65 yo with PMB s/p D&C hysteroscopy - reassurance provided regarding nature of her recent procedure and explained that calcified fibroids are difficult to remove - patient very weary regarding major surgery but is also ready to stop bleeding and pain - Patient is interested in [facility]- she will be referred to [facility] to meet with Dr. Ihor Dow - Rx Megace provided to aid with bleeding - Release of information signed today to obtain records from Dr. Lanny Cramp, including recent ultrasound - All questions were answered.

## 2020-05-28 ENCOUNTER — Other Ambulatory Visit: Payer: Self-pay | Admitting: Obstetrics and Gynecology

## 2020-06-16 ENCOUNTER — Encounter: Payer: Self-pay | Admitting: Obstetrics & Gynecology

## 2020-06-16 ENCOUNTER — Other Ambulatory Visit: Payer: Self-pay

## 2020-06-16 ENCOUNTER — Ambulatory Visit (INDEPENDENT_AMBULATORY_CARE_PROVIDER_SITE_OTHER): Payer: BC Managed Care – PPO | Admitting: Obstetrics & Gynecology

## 2020-06-16 VITALS — BP 132/73 | HR 94 | Wt 200.0 lb

## 2020-06-16 DIAGNOSIS — D500 Iron deficiency anemia secondary to blood loss (chronic): Secondary | ICD-10-CM

## 2020-06-16 DIAGNOSIS — R102 Pelvic and perineal pain: Secondary | ICD-10-CM

## 2020-06-16 DIAGNOSIS — N950 Postmenopausal bleeding: Secondary | ICD-10-CM | POA: Diagnosis not present

## 2020-06-16 NOTE — Progress Notes (Signed)
Subjective:     Lindsay Carroll is a 65 y.o. female here for a routine exam. G2P0020  Current complaints: Pt referred by Dr. Elly Carroll for robot assisted hysterectomy. Patient has been postmenopausal for several years. She reports new onset of vaginal bleeding in 2020 and was seen by Dr. Glo Carroll a few years prev. According to pt, her recommended a hysterectomy. Pt decided to get another opinion and went to see Dr. Lanny Carroll at [facility]. She reports intermittent vaginal bleeding since 2020. Most recently in early March she bled so much that she became anemic and required a blood transfusion. Patient underwent a D&C, hysteroscopy with myosure with Dr. Lanny Carroll on 04/30/20. Patient is dissatisfied with the results of the procedure as the fibroid was reportedly too calcified to remove hysteroscopically. She reports that she no longer trusts Dr. Lanny Carroll to perform a hysterectomy as she feels that the problematic fibroid was not successfully removed with the Myosure. Patient reports intermittent vaginal bleeding at times heavy with clots and other days consisting of vaginal spotting. Patient report 6/10 cramping pain not relieved by ibuprofen. Pain is worst with vaginal bleeding.  Pt reports that the bleeding is partially controlled with the Megace but, the pain persists. She finds driving to work in [place] a problem. She would like to proceed with surgery ASAP.    Gynecologic History No LMP recorded (lmp unknown). Patient is postmenopausal. Contraception: post menopausal status Last Pap: 03/30/2018. Results were: normal Last mammogram: unknown  Obstetric History OB History  Gravida Para Term Preterm AB Living  2       2    SAB IAB Ectopic Multiple Live Births               # Outcome Date GA Lbr Len/2nd Weight Sex Delivery Anes PTL Lv  2 AB           1 AB              The following portions of the patient's history were reviewed and updated as appropriate: allergies, current medications, past family  history, past medical history, past social history, past surgical history and problem list.  Review of Systems Pertinent items are noted in HPI.    Objective:  BP 132/73   Pulse 94   Wt 200 lb (90.7 kg)   LMP  (LMP Unknown)   BMI 34.33 kg/m   CONSTITUTIONAL: Well-developed, well-nourished female in no acute distress.  HENT:  Normocephalic, atraumatic EYES: Conjunctivae and EOM are normal. No scleral icterus.  NECK: Normal range of motion SKIN: Skin is warm and dry. No rash noted. Not diaphoretic.No pallor. Lindsay Carroll: Alert and oriented to person, place, and time. Normal coordination.  Abd: soft, NT, ND Pelvic: uterus 10 weeks sized. Pelvis is very narrow. Pt has some mod vaginismus.      10/04/2018 Lindsay Carroll is a 65 y.o. G2P0020 she is here for a pelvic sonogram for postmenopausal bleeding.  Uterus                      9. x 6.5 x 6.8 cm, Total uterine volume 210 cc,enlarged heterogeneous anteverted uterus with a 5.8 x 5.4 x 5.7 cm submucosal fibroid  Endometrium          unable to visualized endometrium  Right ovary             2.1 x 1.4 x 2 cm, wnl,limited view   Left ovary  3.1 x 2 x 3.2 cm, wnl,limited view   No free fluid   Technician Comments:  PELVIC US TA/TV:enlarged heterogeneous anteverted uterus with a 5.8 x 5.4 x 5.7 cm submucosal fibroid,unable to visualized endometrium,normal left ovary,normal right ovary (limited view),pelvic pain during ultrasound,no free fluid   04/30/2020 FINAL MICROSCOPIC DIAGNOSIS:   A. FIBROID:  - Inactive endometrium with hormone effect.  - Benign smooth muscle consistent with leiomyoma.  - No hyperplasia or malignancy.   B. ENDOMETRIAL CURETTINGS:  - Polypoid fragments of inactive endometrium with hormone effect.  - Benign squamous and endocervical epithelium.  - No hyperplasia or malignancy.  Assessment:   PMPB Chronic pelvic pain   Plan:   Patient desires surgical management with RATH with  BSO. The risks of surgery were discussed in detail with the patient including but not limited to: bleeding which may require transfusion or reoperation; infection which may require prolonged hospitalization or re-hospitalization and antibiotic therapy; injury to bowel, bladder, ureters and major vessels or other surrounding organs; need for additional procedures including laparotomy; thromboembolic phenomenon, incisional problems and other postoperative or anesthesia complications.  Patient was told that the likelihood that her condition and symptoms will be treated effectively with this surgical management was very high; the postoperative expectations were also discussed in detail. The patient also understands the alternative treatment options which were discussed in full. All questions were answered.  She was told that she will be contacted by our surgical scheduler regarding the time and date of her surgery; routine preoperative instructions of having nothing to eat or drink after midnight on the day prior to surgery and also coming to the hospital 1 1/2 hours prior to her time of surgery were also emphasized.  She was told she may be called for a preoperative appointment about a week prior to surgery and will be given further preoperative instructions at that visit. Printed patient education handouts about the procedure were given to the patient to review at home.  Total face-to-face time with patient, review of chart, discussion with consultant and coordination of care was 30 min.    [name], M.D., [facility] June

## 2020-06-16 NOTE — Patient Instructions (Signed)
Total Laparoscopic Hysterectomy A total laparoscopic hysterectomy is a minimally invasive surgery to remove the uterus and cervix. The fallopian tubes and ovaries can also be removed during this surgery, if necessary. This procedure may be done to treat problems such as:  Growths in the uterus (uterine fibroids) that are not cancer but cause symptoms.  A condition that causes the lining of the uterus to grow in other areas (endometriosis).  Problems with pelvic support.  Cancer of the cervix, ovaries, uterus, or tissue that lines the uterus (endometrium).  Excessive bleeding in the uterus. After this procedure, you will no longer be able to have a baby, and you will no longer have a menstrual period. Tell a health care provider about:  Any allergies you have.  All medicines you are taking, including vitamins, herbs, eye drops, creams, and over-the-counter medicines.  Any problems you or family members have had with anesthetic medicines.  Any blood disorders you have.  Any surgeries you have had.  Any medical conditions you have.  Whether you are pregnant or may be pregnant. What are the risks? Generally, this is a safe procedure. However, problems may occur, including:  Infection.  Bleeding.  Blood clots in the legs or lungs.  Allergic reactions to medicines.  Damage to nearby structures or organs.  Having to change from this surgery to one in which a large incision is made in the abdomen (abdominal hysterectomy). What happens before the procedure? Staying hydrated Follow instructions from your health care provider about hydration, which may include:  Up to 2 hours before the procedure - you may continue to drink clear liquids, such as water, clear fruit juice, black coffee, and plain tea.   Eating and drinking restrictions Follow instructions from your health care provider about eating and drinking, which may include:  8 hours before the procedure - stop eating  heavy meals or foods, such as meat, fried foods, or fatty foods.  6 hours before the procedure - stop eating light meals or foods, such as toast or cereal.  6 hours before the procedure - stop drinking milk or drinks that contain milk.  2 hours before the procedure - stop drinking clear liquids. Medicines  Ask your health care provider about: ? Changing or stopping your regular medicines. This is especially important if you are taking diabetes medicines or blood thinners. ? Taking medicines such as aspirin and ibuprofen. These medicines can thin your blood. Do not take these medicines unless your health care provider tells you to take them. ? Taking over-the-counter medicines, vitamins, herbs, and supplements.  You may be asked to take medicine that helps you have a bowel movement (laxative) to prevent constipation. General instructions  If you were asked to do bowel preparation before the procedure, follow instructions from your health care provider.  This procedure can affect the way you feel about yourself. Talk with your health care provider about the physical and emotional changes hysterectomy may cause.  Do not use any products that contain nicotine or tobacco for at least 4 weeks before the procedure. These products include cigarettes, chewing tobacco, and vaping devices, such as e-cigarettes. If you need help quitting, ask your health care provider.  Plan to have a responsible adult take you home from the hospital or clinic.  Plan to have a responsible adult care for you for the time you are told after you leave the hospital or clinic. This is important. Surgery safety Ask your health care provider:  How your surgery  site will be marked.  What steps will be taken to help prevent infection. These may include: ? Removing hair at the surgery site. ? Washing skin with a germ-killing soap. ? Receiving antibiotic medicine. What happens during the procedure?  An IV will be  inserted into one of your veins.  You will be given one or more of the following: ? A medicine to help you relax (sedative). ? A medicine to make you fall asleep (general anesthetic). ? A medicine to numb the area (local anesthetic). ? A medicine that is injected into your spine to numb the area below and slightly above the injection site (spinal anesthetic). ? A medicine that is injected into an area of your body to numb everything below the injection site (regional anesthetic).  A gas will be used to inflate your abdomen. This will allow your surgeon to look inside your abdomen and do the surgery.  Three or four small incisions will be made in your abdomen.  A small device with a light (laparoscope) will be inserted into one of your incisions. Surgical instruments will be inserted through the other incisions in order to perform the procedure.  Your uterus and cervix may be removed through your vagina or cut into small pieces and removed through the small incisions. Any other organs that need to be removed will also be removed this way.  The gas will be released from inside your abdomen.  Your incisions will be closed with stitches (sutures), skin glue, or adhesive strips.  A bandage (dressing) may be placed over your incisions. The procedure may vary among health care providers and hospitals. What happens after the procedure?  Your blood pressure, heart rate, breathing rate, and blood oxygen level will be monitored until you leave the hospital or clinic.  You will be given medicine for pain as needed.  You will be encouraged to walk as soon as possible. You will also use a device to help you breathe or do breathing exercises to keep your lungs clear.  You may have to wear compression stockings. These stockings help to prevent blood clots and reduce swelling in your legs.  You will need to wear a sanitary pad for vaginal discharge or bleeding. Summary  Total laparoscopic  hysterectomy is a procedure to remove your uterus, cervix, and sometimes the fallopian tubes and ovaries.  This procedure can affect the way you feel about yourself. Talk with your health care provider about the physical and emotional changes hysterectomy may cause.  After this procedure, you will no longer be able to have a baby, and you will no longer have a menstrual period.  You will be given pain medicine to control discomfort after this procedure.  Plan to have a responsible adult take you home from the hospital or clinic. This information is not intended to replace advice given to you by your health care provider. Make sure you discuss any questions you have with your health care provider. Document Revised: 10/05/2019 Document Reviewed: 10/05/2019 Elsevier Patient Education  2021 [employer]. Total Laparoscopic Hysterectomy, Care After The following information offers guidance on how to care for yourself after your procedure. Your health care provider may also give you more specific instructions. If you have problems or questions, contact your health care provider. What can I expect after the procedure? After the procedure, it is common to have:  Pain, bruising, and numbness around your incisions.  Tiredness (fatigue).  Poor appetite.  Less interest in sex.  Vaginal discharge   or bleeding. You will need to use a sanitary pad after this procedure.  Feelings of sadness or other emotions. If your ovaries were also removed, it is also common to have symptoms of menopause, such as hot flashes, night sweats, and lack of sleep (insomnia). Follow these instructions at home: Medicines  Take over-the-counter and prescription medicines only as told by your health care provider.  Ask your health care provider if the medicine prescribed to you: ? Requires you to avoid driving or using machinery. ? Can cause constipation. You may need to take these actions to prevent or treat  constipation:  Drink enough fluid to keep your urine pale yellow.  Take over-the-counter or prescription medicines.  Eat foods that are high in fiber, such as beans, whole grains, and fresh fruits and vegetables.  Limit foods that are high in fat and processed sugars, such as fried or sweet foods. Incision care  Follow instructions from your health care provider about how to take care of your incisions. Make sure you: ? Wash your hands with soap and water for at least 20 seconds before and after you change your bandage (dressing). If soap and water are not available, use hand sanitizer. ? Change your dressing as told by your health care provider. ? Leave stitches (sutures), skin glue, or adhesive strips in place. These skin closures may need to stay in place for 2 weeks or longer. If adhesive strip edges start to loosen and curl up, you may trim the loose edges. Do not remove adhesive strips completely unless your health care provider tells you to do that.  Check your incision areas every day for signs of infection. Check for: ? More redness, swelling, or pain. ? Fluid or blood. ? Warmth. ? Pus or a bad smell.   Activity  Rest as told by your health care provider.  Avoid sitting for a long time without moving. Get up to take short walks every 1-2 hours. This is important to improve blood flow and breathing. Ask for help if you feel weak or unsteady.  Return to your normal activities as told by your health care provider. Ask your health care provider what activities are safe for you.  Do not lift anything that is heavier than 10 lb (4.5 kg), or the limit that you are told, for one month after surgery or until your health care provider says that it is safe.  If you were given a sedative during the procedure, it can affect you for several hours. Do not drive or operate machinery until your health care provider says that it is safe.   Lifestyle  Do not use any products that contain  nicotine or tobacco. These products include cigarettes, chewing tobacco, and vaping devices, such as e-cigarettes. These can delay healing after surgery. If you need help quitting, ask your health care provider.  Do not drink alcohol until your health care provider approves. General instructions  Do not douche, use tampons, or have sex for at least 6 weeks, or as told by your health care provider.  If you struggle with physical or emotional changes after your procedure, speak with your health care provider or a therapist.  Do not take baths, swim, or use a hot tub until your health care provider approves. You may only be allowed to take showers for 2-3 weeks.  Keep your dressing dry until your health care provider says it can be removed.  Try to have someone at home with you for the   first 1-2 weeks to help with your daily chores.  Wear compression stockings as told by your health care provider. These stockings help to prevent blood clots and reduce swelling in your legs.  Keep all follow-up visits. This is important.   Contact a health care provider if:  You have any of these signs of infection: ? Chills or a fever. ? More redness, swelling, or pain around an incision. ? Fluid or blood coming from an incision. ? Warmth coming from an incision. ? Pus or a bad smell coming from an incision.  An incision opens.  You feel dizzy or light-headed.  You have pain or bleeding when you urinate, or you are unable to urinate.  You have abnormal vaginal discharge.  You have pain that does not get better with medicine. Get help right away if:  You have a fever and your symptoms suddenly get worse.  You have severe abdominal pain.  You have chest pain or shortness of breath.  You faint.  You have pain, swelling, or redness in your leg.  You have heavy vaginal bleeding with blood clots, soaking through a sanitary pad in less than 1 hour. These symptoms may represent a serious problem  that is an emergency. Do not wait to see if the symptoms will go away. Get medical help right away. Call your local emergency services (911 in the [place] Do not drive yourself to the hospital. Summary  After the procedure, it is common to have pain and bruising around your incisions.  Do not take baths, swim, or use a hot tub until your health care provider approves.  Do not lift anything that is heavier than 10 lb (4.5 kg), or the limit that you are told, for one month after surgery or until your health care provider says that it is safe.  Tell your health care provider if you have any signs or symptoms of infection after the procedure.  Get help right away if you have severe abdominal pain, chest pain, shortness of breath, or heavy bleeding from your vagina. This information is not intended to replace advice given to you by your health care provider. Make sure you discuss any questions you have with your health care provider. Document Revised: 10/05/2019 Document Reviewed: 10/05/2019 Elsevier Patient Education  2021 Elsevier Inc.  

## 2020-06-18 ENCOUNTER — Encounter: Payer: Self-pay | Admitting: Obstetrics & Gynecology

## 2020-06-19 ENCOUNTER — Telehealth: Payer: Self-pay | Admitting: *Deleted

## 2020-06-19 ENCOUNTER — Encounter: Payer: Self-pay | Admitting: *Deleted

## 2020-06-19 NOTE — Telephone Encounter (Signed)
Call to patient. Advised surgery scheduled for 07-22-20 at 0730 at [facility]. Aware to arrive 0530. Advised letter has been mailed to confirm dates and provide Covid Testsing instructions and hospital brochure.  Encounter closed.

## 2020-07-04 ENCOUNTER — Institutional Professional Consult (permissible substitution): Payer: BC Managed Care – PPO | Admitting: Obstetrics & Gynecology

## 2020-07-04 ENCOUNTER — Telehealth: Payer: Self-pay | Admitting: General Practice

## 2020-07-04 NOTE — Telephone Encounter (Signed)
Left message on voice mail informing patient that she will need to come to the office to sign a hysterectomy consent prior to her surgery on 07/22/2020.

## 2020-07-04 NOTE — Telephone Encounter (Signed)
-----   Message from Huey Romans, RN sent at 07/04/2020 10:27 AM EDT ----- Regarding: Hysterectomy Consent Patient is scheduled for Hysterectomy on 07-22-20- Need hysterectomy consent ASAP.  Thank you

## 2020-07-11 ENCOUNTER — Telehealth: Payer: Self-pay | Admitting: General Practice

## 2020-07-11 NOTE — Telephone Encounter (Signed)
-----   Message from Huey Romans, RN sent at 07/04/2020 10:27 AM EDT ----- Regarding: Hysterectomy Consent Patient is scheduled for Hysterectomy on 07-22-20- Need hysterectomy consent ASAP.  Thank you

## 2020-07-11 NOTE — Telephone Encounter (Signed)
Attempted to call patient twice to inform her that she will need to contact our office to sign Hysterectomy consent.  Left two messages on voice mail.

## 2020-07-16 ENCOUNTER — Encounter (HOSPITAL_BASED_OUTPATIENT_CLINIC_OR_DEPARTMENT_OTHER): Payer: Self-pay | Admitting: Obstetrics & Gynecology

## 2020-07-16 ENCOUNTER — Other Ambulatory Visit: Payer: Self-pay

## 2020-07-16 DIAGNOSIS — Z79899 Other long term (current) drug therapy: Secondary | ICD-10-CM | POA: Diagnosis not present

## 2020-07-16 DIAGNOSIS — Y658 Other specified misadventures during surgical and medical care: Secondary | ICD-10-CM | POA: Diagnosis not present

## 2020-07-16 DIAGNOSIS — Y92234 Operating room of hospital as the place of occurrence of the external cause: Secondary | ICD-10-CM | POA: Diagnosis not present

## 2020-07-16 DIAGNOSIS — Y763 Surgical instruments, materials and obstetric and gynecological devices (including sutures) associated with adverse incidents: Secondary | ICD-10-CM | POA: Diagnosis not present

## 2020-07-16 DIAGNOSIS — S3733XA Laceration of urethra, initial encounter: Secondary | ICD-10-CM | POA: Diagnosis not present

## 2020-07-16 DIAGNOSIS — N840 Polyp of corpus uteri: Secondary | ICD-10-CM | POA: Diagnosis not present

## 2020-07-16 DIAGNOSIS — N800 Endometriosis of uterus: Secondary | ICD-10-CM | POA: Diagnosis not present

## 2020-07-16 DIAGNOSIS — D251 Intramural leiomyoma of uterus: Secondary | ICD-10-CM | POA: Diagnosis not present

## 2020-07-16 DIAGNOSIS — Z9889 Other specified postprocedural states: Secondary | ICD-10-CM | POA: Diagnosis not present

## 2020-07-16 DIAGNOSIS — N841 Polyp of cervix uteri: Secondary | ICD-10-CM | POA: Diagnosis not present

## 2020-07-16 NOTE — Progress Notes (Signed)
YOU ARE SCHEDULED FOR A COVID TEST 07-18-2020 @1230  PM. THIS TEST MUST BE DONE BEFORE SURGERY. GO TO  [place]. [place], [place], IT IS APPROXIMATELY 2 MINUTES PAST [employer] ON THE RIGHT AND REMAIN IN YOUR CAR, THIS IS A DRIVE UP TEST.       Your procedure is scheduled on 07-22-2020  Report to [facility]   Call this number if you have problems the morning of surgery  :[phone].   OUR ADDRESS IS [address].  WE ARE LOCATED IN [facility].  PLEASE BRING YOUR INSURANCE CARD AND PHOTO ID DAY OF SURGERY.  ONLY ONE PERSON ALLOWED IN FACILITY WAITING AREA.                                     REMEMBER:  DO NOT EAT FOOD, CANDY GUM OR MINTS  AFTER MIDNIGHT . YOU MAY HAVE CLEAR LIQUIDS FROM MIDNIGHT UNTIL 430 AM.  NO CLEAR LIQUIDS AFTER 430 AM MORNING OF SURGERY.YOU MAY  BRUSH YOUR TEETH MORNING OF SURGERY AND RINSE YOUR MOUTH OUT, NO CHEWING GUM CANDY OR MINTS.    CLEAR LIQUID DIET   Foods Allowed                                                                     Foods Excluded  Coffee and tea, regular and decaf                             liquids that you cannot  Plain Jell-O any favor except red or purple                                           see through such as: Fruit ices (not with fruit pulp)                                     milk, soups, orange juice  Iced Popsicles                                    All solid food Carbonated beverages, regular and diet                                    Cranberry, grape and apple juices Sports drinks like Gatorade Lightly seasoned clear broth or consume(fat free) Sugar, honey syrup  Sample Menu Breakfast                                Lunch  Supper Cranberry juice                    Beef broth                            Chicken broth Jell-O                                     Grape juice                           Apple  juice Coffee or tea                        Jell-O                                      Popsicle                                                Coffee or tea                        Coffee or tea  _____________________________________________________________________     TAKE THESE MEDICATIONS MORNING OF SURGERY WITH A SIP OF WATER: NONE  ONE VISITOR IS ALLOWED IN WAITING ROOM ONLY DAY OF SURGERY.  NO VISITOR MAY SPEND THE NIGHT.  VISITOR ARE ALLOWED TO STAY UNTIL 800 PM.                                    DO NOT WEAR JEWERLY, MAKE UP. DO NOT WEAR LOTIONS, POWDERS, PERFUMES OR NAIL POLISH. DO NOT SHAVE FOR 24 HOURS PRIOR TO DAY OF SURGERY. MEN MAY SHAVE FACE AND NECK. CONTACTS, GLASSES, OR DENTURES MAY NOT BE WORN TO SURGERY.                                    [facility] IS NOT RESPONSIBLE  FOR ANY BELONGINGS.                                                                    Marland Kitchen           [facility] - Preparing for Surgery Before surgery, you can play an important role.  Because skin is not sterile, your skin needs to be as free of germs as possible.  You can reduce the number of germs on your skin by washing with CHG (chlorahexidine gluconate) soap before surgery.  CHG is an antiseptic cleaner which kills germs and bonds with the skin to continue killing germs even after washing. Please DO NOT use if you have an allergy to CHG or antibacterial soaps.  If your skin becomes reddened/irritated stop  using the CHG and inform your nurse when you arrive at Short Stay. Do not shave (including legs and underarms) for at least 48 hours prior to the first CHG shower.  You may shave your face/neck. Please follow these instructions carefully:  1.  Shower with CHG Soap the night before surgery and the  morning of Surgery.  2.  If you choose to wash your hair, wash your hair first as usual with your  normal  shampoo.  3.  After you shampoo, rinse your hair and body thoroughly to remove the  shampoo.                             4.  Use CHG as you would any other liquid soap.  You can apply chg directly  to the skin and wash                      Gently with a scrungie or clean washcloth.  5.  Apply the CHG Soap to your body ONLY FROM THE NECK DOWN.   Do not use on face/ open                           Wound or open sores. Avoid contact with eyes, ears mouth and genitals (private parts).                       Wash face,  Genitals (private parts) with your normal soap.             6.  Wash thoroughly, paying special attention to the area where your surgery  will be performed.  7.  Thoroughly rinse your body with warm water from the neck down.  8.  DO NOT shower/wash with your normal soap after using and rinsing off  the CHG Soap.                9.  Pat yourself dry with a clean towel.            10.  Wear clean pajamas.            11.  Place clean sheets on your bed the night of your first shower and do not  sleep with pets. Day of Surgery : Do not apply any lotions/deodorants the morning of surgery.  Please wear clean clothes to the hospital/surgery center.  FAILURE TO FOLLOW THESE INSTRUCTIONS MAY RESULT IN THE CANCELLATION OF YOUR SURGERY PATIENT SIGNATURE_________________________________  NURSE SIGNATURE__________________________________  ________________________________________________________________________                                                        QUESTIONS Hansel Feinstein PRE OP NURSE PHONE [phone].

## 2020-07-16 NOTE — Progress Notes (Signed)
Spoke w/ via phone for pre-op interview---PT Lab needs dos---- NONE              Lab results------LAB APPT 07-18-2020 1100 AM FOR CBC T & S  COVID test ----07-18-2020 1230 (EXTENDED STAY) Medications to take am of surgery: none Diabetic medication -----N/A Patient instructed no nail polish to be worn day of surgery Patient instructed to bring photo id and insurance card day of surgery Patient aware to have Driver (ride ) / caregiver  SISTER OR NEPHEW   for 24 hours after surgery  Patient Special Instructions -----PT GIVEN EXTENDED STAY INSTRUCTIONSPatient verbalized understanding of instructions that were given at this phone interview. Patient denies shortness of breath, chest pain, fever, cough at this phone interview.

## 2020-07-18 ENCOUNTER — Other Ambulatory Visit (HOSPITAL_COMMUNITY): Payer: BC Managed Care – PPO

## 2020-07-18 ENCOUNTER — Encounter (HOSPITAL_COMMUNITY): Admission: RE | Admit: 2020-07-18 | Payer: BC Managed Care – PPO | Source: Ambulatory Visit

## 2020-07-21 ENCOUNTER — Other Ambulatory Visit (HOSPITAL_COMMUNITY)
Admission: RE | Admit: 2020-07-21 | Discharge: 2020-07-21 | Disposition: A | Discharge status: Home / Self Care | Payer: BC Managed Care – PPO | Source: Ambulatory Visit | Attending: Obstetrics & Gynecology | Admitting: Obstetrics & Gynecology

## 2020-07-21 ENCOUNTER — Encounter (HOSPITAL_COMMUNITY)
Admission: RE | Admit: 2020-07-21 | Discharge: 2020-07-21 | Disposition: A | Discharge status: Home / Self Care | Payer: BC Managed Care – PPO | Source: Ambulatory Visit | Attending: Obstetrics & Gynecology | Admitting: Obstetrics & Gynecology

## 2020-07-21 ENCOUNTER — Other Ambulatory Visit: Payer: Self-pay

## 2020-07-21 DIAGNOSIS — Z01812 Encounter for preprocedural laboratory examination: Secondary | ICD-10-CM | POA: Insufficient documentation

## 2020-07-21 DIAGNOSIS — Z20822 Contact with and (suspected) exposure to covid-19: Secondary | ICD-10-CM | POA: Diagnosis not present

## 2020-07-21 LAB — CBC
HCT: 42.4 % (ref 36.0–46.0)
Hemoglobin: 12.6 g/dL (ref 12.0–15.0)
MCH: 22.1 pg — ABNORMAL LOW (ref 26.0–34.0)
MCHC: 29.7 g/dL — ABNORMAL LOW (ref 30.0–36.0)
MCV: 74.5 fL — ABNORMAL LOW (ref 80.0–100.0)
Platelets: 335 10*3/uL (ref 150–400)
RBC: 5.69 MIL/uL — ABNORMAL HIGH (ref 3.87–5.11)
RDW: 18.7 % — ABNORMAL HIGH (ref 11.5–15.5)
WBC: 8.7 10*3/uL (ref 4.0–10.5)
nRBC: 0 % (ref 0.0–0.2)

## 2020-07-21 LAB — TYPE AND SCREEN
ABO/RH(D): O POS
Antibody Screen: NEGATIVE

## 2020-07-21 LAB — SARS CORONAVIRUS 2 (TAT 6-24 HRS): SARS Coronavirus 2: NEGATIVE

## 2020-07-21 NOTE — Anesthesia Preprocedure Evaluation (Addendum)
Anesthesia Evaluation  Patient identified by MRN, date of birth, ID band Patient awake    Reviewed: Allergy & Precautions, NPO status , Patient's Chart, lab work & pertinent test results  Airway Mallampati: III  TM Distance: >3 FB Neck ROM: Full  Mouth opening: Limited Mouth Opening  Dental  (+) Partial Upper   Pulmonary neg pulmonary ROS,    Pulmonary exam normal breath sounds clear to auscultation       Cardiovascular Exercise Tolerance: Good hypertension (BP elevated, not on any home meds), Normal cardiovascular exam Rhythm:Regular Rate:Normal     Neuro/Psych negative neurological ROS  negative psych ROS   GI/Hepatic negative GI ROS, Neg liver ROS,   Endo/Other  negative endocrine ROS  Renal/GU negative Renal ROS  negative genitourinary   Musculoskeletal negative musculoskeletal ROS (+)   Abdominal   Peds negative pediatric ROS (+)  Hematology  (+) anemia ,   Anesthesia Other Findings   Reproductive/Obstetrics Uterine fibroids and postmenopausal bleeding                            Anesthesia Physical Anesthesia Plan  ASA: III  Anesthesia Plan: General   Post-op Pain Management:    Induction: Intravenous  PONV Risk Score and Plan: 3 and Treatment may vary due to age or medical condition, Midazolam, Ondansetron and Dexamethasone  Airway Management Planned: Oral ETT  Additional Equipment: None  Intra-op Plan:   Post-operative Plan: Extubation in OR  Informed Consent: I have reviewed the patients History and Physical, chart, labs and discussed the procedure including the risks, benefits and alternatives for the proposed anesthesia with the patient or authorized representative who has indicated his/her understanding and acceptance.     Dental advisory given  Plan Discussed with: CRNA and Anesthesiologist  Anesthesia Plan Comments: (Will need perioperative BP management.  BP elevated, no formal diagnosis/treatment of hypertension. Advised patient she will need to follow-up with her PCP as she most likely has a diagnosis of essential HTN given her current and preoperative Blood pressures. Norton Blizzard, MD  )        Anesthesia Quick Evaluation

## 2020-07-22 ENCOUNTER — Other Ambulatory Visit: Payer: Self-pay

## 2020-07-22 ENCOUNTER — Encounter (HOSPITAL_BASED_OUTPATIENT_CLINIC_OR_DEPARTMENT_OTHER)
Admission: RE | Disposition: A | Discharge status: Home / Self Care | Payer: Self-pay | Source: Home / Self Care | Attending: Obstetrics & Gynecology

## 2020-07-22 ENCOUNTER — Telehealth: Payer: Self-pay | Admitting: General Practice

## 2020-07-22 ENCOUNTER — Ambulatory Visit (HOSPITAL_BASED_OUTPATIENT_CLINIC_OR_DEPARTMENT_OTHER)
Admission: RE | Admit: 2020-07-22 | Discharge: 2020-07-22 | Disposition: A | Discharge status: Home / Self Care | Payer: BC Managed Care – PPO | Attending: Obstetrics & Gynecology | Admitting: Obstetrics & Gynecology

## 2020-07-22 ENCOUNTER — Ambulatory Visit (HOSPITAL_BASED_OUTPATIENT_CLINIC_OR_DEPARTMENT_OTHER): Payer: BC Managed Care – PPO | Admitting: Physician Assistant

## 2020-07-22 ENCOUNTER — Encounter (HOSPITAL_BASED_OUTPATIENT_CLINIC_OR_DEPARTMENT_OTHER): Payer: Self-pay | Admitting: Obstetrics & Gynecology

## 2020-07-22 ENCOUNTER — Ambulatory Visit (HOSPITAL_BASED_OUTPATIENT_CLINIC_OR_DEPARTMENT_OTHER): Payer: BC Managed Care – PPO | Admitting: Anesthesiology

## 2020-07-22 DIAGNOSIS — D251 Intramural leiomyoma of uterus: Secondary | ICD-10-CM | POA: Diagnosis not present

## 2020-07-22 DIAGNOSIS — S3141XA Laceration without foreign body of vagina and vulva, initial encounter: Secondary | ICD-10-CM

## 2020-07-22 DIAGNOSIS — D649 Anemia, unspecified: Secondary | ICD-10-CM | POA: Diagnosis not present

## 2020-07-22 DIAGNOSIS — S3733XA Laceration of urethra, initial encounter: Secondary | ICD-10-CM | POA: Insufficient documentation

## 2020-07-22 DIAGNOSIS — N939 Abnormal uterine and vaginal bleeding, unspecified: Secondary | ICD-10-CM

## 2020-07-22 DIAGNOSIS — R102 Pelvic and perineal pain: Secondary | ICD-10-CM | POA: Diagnosis not present

## 2020-07-22 DIAGNOSIS — N800 Endometriosis of uterus: Secondary | ICD-10-CM | POA: Diagnosis not present

## 2020-07-22 DIAGNOSIS — Z9889 Other specified postprocedural states: Secondary | ICD-10-CM | POA: Diagnosis not present

## 2020-07-22 DIAGNOSIS — N840 Polyp of corpus uteri: Secondary | ICD-10-CM | POA: Insufficient documentation

## 2020-07-22 DIAGNOSIS — D259 Leiomyoma of uterus, unspecified: Secondary | ICD-10-CM | POA: Diagnosis not present

## 2020-07-22 DIAGNOSIS — Y658 Other specified misadventures during surgical and medical care: Secondary | ICD-10-CM | POA: Diagnosis not present

## 2020-07-22 DIAGNOSIS — N841 Polyp of cervix uteri: Secondary | ICD-10-CM | POA: Insufficient documentation

## 2020-07-22 DIAGNOSIS — N852 Hypertrophy of uterus: Secondary | ICD-10-CM

## 2020-07-22 DIAGNOSIS — N950 Postmenopausal bleeding: Secondary | ICD-10-CM | POA: Diagnosis not present

## 2020-07-22 DIAGNOSIS — D252 Subserosal leiomyoma of uterus: Secondary | ICD-10-CM | POA: Diagnosis not present

## 2020-07-22 DIAGNOSIS — I10 Essential (primary) hypertension: Secondary | ICD-10-CM | POA: Diagnosis not present

## 2020-07-22 DIAGNOSIS — Z79899 Other long term (current) drug therapy: Secondary | ICD-10-CM | POA: Insufficient documentation

## 2020-07-22 DIAGNOSIS — Y92234 Operating room of hospital as the place of occurrence of the external cause: Secondary | ICD-10-CM | POA: Diagnosis not present

## 2020-07-22 DIAGNOSIS — Y763 Surgical instruments, materials and obstetric and gynecological devices (including sutures) associated with adverse incidents: Secondary | ICD-10-CM | POA: Insufficient documentation

## 2020-07-22 DIAGNOSIS — N736 Female pelvic peritoneal adhesions (postinfective): Secondary | ICD-10-CM | POA: Diagnosis not present

## 2020-07-22 HISTORY — PX: ROBOTIC ASSISTED TOTAL HYSTERECTOMY WITH BILATERAL SALPINGO OOPHERECTOMY: SHX6086

## 2020-07-22 SURGERY — HYSTERECTOMY, TOTAL, ROBOT-ASSISTED, LAPAROSCOPIC, WITH BILATERAL SALPINGO-OOPHORECTOMY
Anesthesia: General | Site: Abdomen

## 2020-07-22 MED ORDER — OXYCODONE HCL 5 MG/5ML PO SOLN: 5.0000 mg | Freq: Once | ORAL | Status: DC | PRN

## 2020-07-22 MED ORDER — ONDANSETRON HCL 4 MG/2ML IJ SOLN
INTRAMUSCULAR | Status: DC | PRN
  Administered 2020-07-22: 4 mg via INTRAVENOUS

## 2020-07-22 MED ORDER — BUPIVACAINE HCL (PF) 0.5 % IJ SOLN
INTRAMUSCULAR | Status: DC | PRN
  Administered 2020-07-22: 30 mL

## 2020-07-22 MED ORDER — PROMETHAZINE HCL 25 MG/ML IJ SOLN: 6.2500 mg | INTRAMUSCULAR | Status: DC | PRN

## 2020-07-22 MED ORDER — IBUPROFEN 200 MG PO TABS: 600.0000 mg | ORAL_TABLET | Freq: Four times a day (QID) | ORAL | Status: DC

## 2020-07-22 MED ORDER — LIDOCAINE HCL (CARDIAC) PF 100 MG/5ML IV SOSY
PREFILLED_SYRINGE | INTRAVENOUS | Status: DC | PRN
  Administered 2020-07-22: 100 mg via INTRAVENOUS

## 2020-07-22 MED ORDER — IBUPROFEN 600 MG PO TABS: 600.0000 mg | ORAL_TABLET | Freq: Four times a day (QID) | ORAL | 1 refills | Status: DC | PRN

## 2020-07-22 MED ORDER — POLYETHYLENE GLYCOL 3350 17 G PO PACK: 17.0000 g | PACK | Freq: Every day | ORAL | Status: DC | PRN

## 2020-07-22 MED ORDER — DEXAMETHASONE SODIUM PHOSPHATE 4 MG/ML IJ SOLN
INTRAMUSCULAR | Status: DC | PRN
  Administered 2020-07-22: 10 mg via INTRAVENOUS

## 2020-07-22 MED ORDER — FENTANYL CITRATE (PF) 250 MCG/5ML IJ SOLN
INTRAMUSCULAR | Status: AC
  Filled 2020-07-22: qty 5

## 2020-07-22 MED ORDER — PROPOFOL 10 MG/ML IV BOLUS
INTRAVENOUS | Status: DC | PRN
  Administered 2020-07-22: 150 mg via INTRAVENOUS
  Administered 2020-07-22: 50 mg via INTRAVENOUS

## 2020-07-22 MED ORDER — SUGAMMADEX SODIUM 200 MG/2ML IV SOLN
INTRAVENOUS | Status: DC | PRN
  Administered 2020-07-22: 200 mg via INTRAVENOUS

## 2020-07-22 MED ORDER — OXYCODONE HCL 5 MG PO TABS: 5.0000 mg | ORAL_TABLET | Freq: Once | ORAL | Status: DC | PRN

## 2020-07-22 MED ORDER — CEFAZOLIN SODIUM-DEXTROSE 2-4 GM/100ML-% IV SOLN
2.0000 g | INTRAVENOUS | Status: AC
  Administered 2020-07-22: 2 g via INTRAVENOUS

## 2020-07-22 MED ORDER — DOCUSATE SODIUM 100 MG PO CAPS
ORAL_CAPSULE | ORAL | Status: AC
  Filled 2020-07-22: qty 1

## 2020-07-22 MED ORDER — PROPOFOL 10 MG/ML IV BOLUS
INTRAVENOUS | Status: AC
  Filled 2020-07-22: qty 40

## 2020-07-22 MED ORDER — FENTANYL CITRATE (PF) 100 MCG/2ML IJ SOLN
INTRAMUSCULAR | Status: AC
  Filled 2020-07-22: qty 2

## 2020-07-22 MED ORDER — DEXTROSE-NACL 5-0.45 % IV SOLN
INTRAVENOUS | Status: AC
Start: 2020-07-22 — End: 2020-07-22

## 2020-07-22 MED ORDER — OXYCODONE-ACETAMINOPHEN 5-325 MG PO TABS
1.0000 | ORAL_TABLET | ORAL | Status: DC | PRN
Start: 2020-07-22 — End: 2020-07-22
  Administered 2020-07-22: 1 via ORAL

## 2020-07-22 MED ORDER — ROCURONIUM BROMIDE 10 MG/ML (PF) SYRINGE
PREFILLED_SYRINGE | INTRAVENOUS | Status: AC
  Filled 2020-07-22: qty 10

## 2020-07-22 MED ORDER — METOCLOPRAMIDE HCL 5 MG/ML IJ SOLN
INTRAMUSCULAR | Status: AC
  Filled 2020-07-22: qty 2

## 2020-07-22 MED ORDER — DOCUSATE SODIUM 100 MG PO CAPS
100.0000 mg | ORAL_CAPSULE | Freq: Two times a day (BID) | ORAL | Status: DC
  Administered 2020-07-22: 100 mg via ORAL

## 2020-07-22 MED ORDER — GABAPENTIN 100 MG PO CAPS
200.0000 mg | ORAL_CAPSULE | ORAL | Status: AC
  Administered 2020-07-22: 200 mg via ORAL

## 2020-07-22 MED ORDER — DOCUSATE SODIUM 100 MG PO CAPS: 100.0000 mg | ORAL_CAPSULE | Freq: Two times a day (BID) | ORAL | 0 refills | Status: DC

## 2020-07-22 MED ORDER — PHENYLEPHRINE HCL (PRESSORS) 10 MG/ML IV SOLN
INTRAVENOUS | Status: DC | PRN
  Administered 2020-07-22: 80 ug via INTRAVENOUS

## 2020-07-22 MED ORDER — KETOROLAC TROMETHAMINE 30 MG/ML IJ SOLN: 30.0000 mg | Freq: Once | INTRAMUSCULAR | Status: DC

## 2020-07-22 MED ORDER — KETOROLAC TROMETHAMINE 30 MG/ML IJ SOLN
30.0000 mg | Freq: Four times a day (QID) | INTRAMUSCULAR | Status: DC
Start: 2020-07-22 — End: 2020-07-22
  Administered 2020-07-22: 30 mg via INTRAVENOUS

## 2020-07-22 MED ORDER — ZOLPIDEM TARTRATE 5 MG PO TABS: 5.0000 mg | ORAL_TABLET | Freq: Every evening | ORAL | Status: DC | PRN

## 2020-07-22 MED ORDER — FENTANYL CITRATE (PF) 100 MCG/2ML IJ SOLN
25.0000 ug | INTRAMUSCULAR | Status: DC | PRN
  Administered 2020-07-22 (×4): 25 ug via INTRAVENOUS

## 2020-07-22 MED ORDER — HYDRALAZINE HCL 20 MG/ML IJ SOLN: 10.0000 mg | INTRAMUSCULAR | Status: DC | PRN

## 2020-07-22 MED ORDER — ROCURONIUM BROMIDE 100 MG/10ML IV SOLN
INTRAVENOUS | Status: DC | PRN
  Administered 2020-07-22 (×2): 20 mg via INTRAVENOUS
  Administered 2020-07-22: 60 mg via INTRAVENOUS

## 2020-07-22 MED ORDER — SIMETHICONE 80 MG PO CHEW
80.0000 mg | CHEWABLE_TABLET | Freq: Four times a day (QID) | ORAL | Status: DC | PRN
Start: 2020-07-22 — End: 2020-07-22
  Administered 2020-07-22: 80 mg via ORAL

## 2020-07-22 MED ORDER — LACTATED RINGERS IV SOLN: INTRAVENOUS | Status: DC

## 2020-07-22 MED ORDER — METOCLOPRAMIDE HCL 5 MG/ML IJ SOLN
INTRAMUSCULAR | Status: DC | PRN
  Administered 2020-07-22: 10 mg via INTRAVENOUS

## 2020-07-22 MED ORDER — TRAMADOL HCL 50 MG PO TABS: 50.0000 mg | ORAL_TABLET | Freq: Four times a day (QID) | ORAL | Status: DC | PRN

## 2020-07-22 MED ORDER — CEFAZOLIN SODIUM-DEXTROSE 2-4 GM/100ML-% IV SOLN
INTRAVENOUS | Status: AC
  Filled 2020-07-22: qty 100

## 2020-07-22 MED ORDER — SIMETHICONE 80 MG PO CHEW
CHEWABLE_TABLET | ORAL | Status: AC
  Filled 2020-07-22: qty 1

## 2020-07-22 MED ORDER — LIDOCAINE HCL (PF) 2 % IJ SOLN
INTRAMUSCULAR | Status: AC
  Filled 2020-07-22: qty 5

## 2020-07-22 MED ORDER — MIDAZOLAM HCL 2 MG/2ML IJ SOLN
INTRAMUSCULAR | Status: AC
  Filled 2020-07-22: qty 2

## 2020-07-22 MED ORDER — SODIUM CHLORIDE 0.9 % IR SOLN
Status: DC | PRN
  Administered 2020-07-22: 1000 mL via INTRAVESICAL
  Administered 2020-07-22: 3000 mL

## 2020-07-22 MED ORDER — ACETAMINOPHEN 500 MG PO TABS
1000.0000 mg | ORAL_TABLET | ORAL | Status: AC
  Administered 2020-07-22: 1000 mg via ORAL

## 2020-07-22 MED ORDER — DEXMEDETOMIDINE HCL 200 MCG/2ML IV SOLN
INTRAVENOUS | Status: DC | PRN
  Administered 2020-07-22 (×2): 8 ug via INTRAVENOUS

## 2020-07-22 MED ORDER — POVIDONE-IODINE 10 % EX SWAB: 2.0000 "application " | Freq: Once | CUTANEOUS | Status: DC

## 2020-07-22 MED ORDER — PHENYLEPHRINE 40 MCG/ML (10ML) SYRINGE FOR IV PUSH (FOR BLOOD PRESSURE SUPPORT)
PREFILLED_SYRINGE | INTRAVENOUS | Status: AC
  Filled 2020-07-22: qty 10

## 2020-07-22 MED ORDER — DROPERIDOL 2.5 MG/ML IJ SOLN
0.6250 mg | Freq: Once | INTRAMUSCULAR | Status: DC | PRN
Start: 2020-07-22 — End: 2020-07-22

## 2020-07-22 MED ORDER — INDIGOTINDISULFONATE SODIUM 8 MG/ML IJ SOLN
INTRAMUSCULAR | Status: DC | PRN
  Administered 2020-07-22: 5 mL via INTRAVENOUS

## 2020-07-22 MED ORDER — HYDROMORPHONE HCL 1 MG/ML IJ SOLN: 0.2000 mg | INTRAMUSCULAR | Status: DC | PRN

## 2020-07-22 MED ORDER — ONDANSETRON HCL 4 MG PO TABS
4.0000 mg | ORAL_TABLET | Freq: Four times a day (QID) | ORAL | Status: DC | PRN
Start: 2020-07-22 — End: 2020-07-22

## 2020-07-22 MED ORDER — MENTHOL 3 MG MT LOZG: 1.0000 | LOZENGE | OROMUCOSAL | Status: DC | PRN

## 2020-07-22 MED ORDER — OXYCODONE-ACETAMINOPHEN 5-325 MG PO TABS
ORAL_TABLET | ORAL | Status: AC
  Filled 2020-07-22: qty 1

## 2020-07-22 MED ORDER — KETOROLAC TROMETHAMINE 30 MG/ML IJ SOLN
INTRAMUSCULAR | Status: DC | PRN
  Administered 2020-07-22: 30 mg via INTRAVENOUS

## 2020-07-22 MED ORDER — ONDANSETRON HCL 4 MG/2ML IJ SOLN
INTRAMUSCULAR | Status: AC
  Filled 2020-07-22: qty 2

## 2020-07-22 MED ORDER — FENTANYL CITRATE (PF) 100 MCG/2ML IJ SOLN
INTRAMUSCULAR | Status: DC | PRN
  Administered 2020-07-22 (×2): 50 ug via INTRAVENOUS
  Administered 2020-07-22 (×2): 100 ug via INTRAVENOUS
  Administered 2020-07-22: 50 ug via INTRAVENOUS

## 2020-07-22 MED ORDER — BISACODYL 10 MG RE SUPP: 10.0000 mg | Freq: Every day | RECTAL | Status: DC | PRN

## 2020-07-22 MED ORDER — PANTOPRAZOLE SODIUM 40 MG IV SOLR
40.0000 mg | Freq: Every day | INTRAVENOUS | Status: DC
Start: 2020-07-22 — End: 2020-07-22

## 2020-07-22 MED ORDER — HYDRALAZINE HCL 20 MG/ML IJ SOLN
INTRAMUSCULAR | Status: DC | PRN
  Administered 2020-07-22: 5 mg via INTRAVENOUS

## 2020-07-22 MED ORDER — KETOROLAC TROMETHAMINE 30 MG/ML IJ SOLN
INTRAMUSCULAR | Status: AC
  Filled 2020-07-22: qty 1

## 2020-07-22 MED ORDER — GABAPENTIN 100 MG PO CAPS
ORAL_CAPSULE | ORAL | Status: AC
  Filled 2020-07-22: qty 2

## 2020-07-22 MED ORDER — SCOPOLAMINE 1 MG/3DAYS TD PT72
MEDICATED_PATCH | TRANSDERMAL | Status: DC | PRN
  Administered 2020-07-22: 1 via TRANSDERMAL

## 2020-07-22 MED ORDER — SOD CITRATE-CITRIC ACID 500-334 MG/5ML PO SOLN
30.0000 mL | ORAL | Status: DC
Start: 2020-07-22 — End: 2020-07-22

## 2020-07-22 MED ORDER — ONDANSETRON HCL 4 MG/2ML IJ SOLN
4.0000 mg | Freq: Four times a day (QID) | INTRAMUSCULAR | Status: DC | PRN
Start: 2020-07-22 — End: 2020-07-22

## 2020-07-22 MED ORDER — OXYCODONE-ACETAMINOPHEN 5-325 MG PO TABS: 1.0000 | ORAL_TABLET | ORAL | 0 refills | Status: DC | PRN

## 2020-07-22 MED ORDER — ACETAMINOPHEN 500 MG PO TABS
ORAL_TABLET | ORAL | Status: AC
  Filled 2020-07-22: qty 2

## 2020-07-22 MED ORDER — DEXAMETHASONE SODIUM PHOSPHATE 10 MG/ML IJ SOLN
INTRAMUSCULAR | Status: AC
  Filled 2020-07-22: qty 1

## 2020-07-22 MED ORDER — HYDRALAZINE HCL 20 MG/ML IJ SOLN
INTRAMUSCULAR | Status: AC
  Filled 2020-07-22: qty 1

## 2020-07-22 MED ORDER — MIDAZOLAM HCL 5 MG/5ML IJ SOLN
INTRAMUSCULAR | Status: DC | PRN
  Administered 2020-07-22: 2 mg via INTRAVENOUS

## 2020-07-22 SURGICAL SUPPLY — 74 items
ADH SKN CLS APL DERMABOND .7 (GAUZE/BANDAGES/DRESSINGS) ×1
APL SRG 38 LTWT LNG FL B (MISCELLANEOUS) ×1
APPLICATOR ARISTA FLEXITIP XL (MISCELLANEOUS) ×3 IMPLANT
APPLIER CLIP 5 13 M/L LIGAMAX5 (MISCELLANEOUS)
APR CLP MED LRG 5 ANG JAW (MISCELLANEOUS)
BARRIER ADHS 3X4 INTERCEED (GAUZE/BANDAGES/DRESSINGS) IMPLANT
BRR ADH 4X3 ABS CNTRL BYND (GAUZE/BANDAGES/DRESSINGS)
CATH FOLEY 3WAY  5CC 16FR (CATHETERS) ×3
CATH FOLEY 3WAY 5CC 16FR (CATHETERS) ×1 IMPLANT
CLIP APPLIE 5 13 M/L LIGAMAX5 (MISCELLANEOUS) IMPLANT
COVER BACK TABLE 60X90IN (DRAPES) ×3 IMPLANT
COVER TIP SHEARS 8 DVNC (MISCELLANEOUS) ×1 IMPLANT
COVER TIP SHEARS 8MM DA VINCI (MISCELLANEOUS) ×3
COVER WAND RF STERILE (DRAPES) ×3 IMPLANT
DECANTER SPIKE VIAL GLASS SM (MISCELLANEOUS) ×3 IMPLANT
DEFOGGER SCOPE WARMER CLEARIFY (MISCELLANEOUS) ×3 IMPLANT
DERMABOND ADVANCED (GAUZE/BANDAGES/DRESSINGS) ×2
DERMABOND ADVANCED .7 DNX12 (GAUZE/BANDAGES/DRESSINGS) ×1 IMPLANT
DRAPE ARM DVNC X/XI (DISPOSABLE) ×4 IMPLANT
DRAPE COLUMN DVNC XI (DISPOSABLE) ×1 IMPLANT
DRAPE DA VINCI XI ARM (DISPOSABLE) ×12
DRAPE DA VINCI XI COLUMN (DISPOSABLE) ×3
DRAPE UTILITY XL STRL (DRAPES) ×3 IMPLANT
DURAPREP 26ML APPLICATOR (WOUND CARE) ×3 IMPLANT
ELECT REM PT RETURN 9FT ADLT (ELECTROSURGICAL) ×3
ELECTRODE REM PT RTRN 9FT ADLT (ELECTROSURGICAL) ×1 IMPLANT
GLOVE SURG ENC MOIS LTX SZ7 (GLOVE) ×9 IMPLANT
GLOVE SURG UNDER POLY LF SZ7 (GLOVE) ×9 IMPLANT
HEMOSTAT ARISTA ABSORB 3G PWDR (HEMOSTASIS) ×3 IMPLANT
HOLDER FOLEY CATH W/STRAP (MISCELLANEOUS) IMPLANT
IRRIG SUCT STRYKERFLOW 2 WTIP (MISCELLANEOUS) ×3
IRRIGATION SUCT STRKRFLW 2 WTP (MISCELLANEOUS) ×1 IMPLANT
KIT TURNOVER CYSTO (KITS) ×3 IMPLANT
LEGGING LITHOTOMY PAIR STRL (DRAPES) ×3 IMPLANT
MANIPULATOR ADVINCU DEL 2.5 PL (MISCELLANEOUS) IMPLANT
MANIPULATOR ADVINCU DEL 3.0 PL (MISCELLANEOUS) IMPLANT
MANIPULATOR ADVINCU DEL 3.5 PL (MISCELLANEOUS) IMPLANT
MANIPULATOR ADVINCU DEL 4.0 PL (MISCELLANEOUS) IMPLANT
NEEDLE INSUFFLATION 120MM (ENDOMECHANICALS) ×3 IMPLANT
OBTURATOR OPTICAL STANDARD 8MM (TROCAR) ×3
OBTURATOR OPTICAL STND 8 DVNC (TROCAR) ×1
OBTURATOR OPTICALSTD 8 DVNC (TROCAR) ×1 IMPLANT
OCCLUDER COLPOPNEUMO (BALLOONS) ×3 IMPLANT
PACK ROBOT WH (CUSTOM PROCEDURE TRAY) ×3 IMPLANT
PACK ROBOTIC GOWN (GOWN DISPOSABLE) ×3 IMPLANT
PACK TRENDGUARD 450 HYBRID PRO (MISCELLANEOUS) IMPLANT
PAD OB MATERNITY 4.3X12.25 (PERSONAL CARE ITEMS) ×3 IMPLANT
PAD PREP 24X48 CUFFED NSTRL (MISCELLANEOUS) ×3 IMPLANT
PROTECTOR NERVE ULNAR (MISCELLANEOUS) ×6 IMPLANT
SEAL CANN UNIV 5-8 DVNC XI (MISCELLANEOUS) ×3 IMPLANT
SEAL XI 5MM-8MM UNIVERSAL (MISCELLANEOUS) ×9
SEALER VESSEL DA VINCI XI (MISCELLANEOUS) ×3
SEALER VESSEL EXT DVNC XI (MISCELLANEOUS) ×1 IMPLANT
SET IRRIG Y TYPE TUR BLADDER L (SET/KITS/TRAYS/PACK) ×3 IMPLANT
SET TRI-LUMEN FLTR TB AIRSEAL (TUBING) ×3 IMPLANT
SPONGE T-LAP 4X18 ~~LOC~~+RFID (SPONGE) ×3 IMPLANT
SUT VIC AB 0 CT1 27 (SUTURE) ×6
SUT VIC AB 0 CT1 27XBRD ANBCTR (SUTURE) ×2 IMPLANT
SUT VIC AB 3-0 CT1 36 (SUTURE) ×3 IMPLANT
SUT VIC AB 4-0 PS2 18 (SUTURE) ×9 IMPLANT
SUT VICRYL 0 UR6 27IN ABS (SUTURE) IMPLANT
SUT VLOC 180 0 9IN  GS21 (SUTURE) ×3
SUT VLOC 180 0 9IN GS21 (SUTURE) ×1 IMPLANT
SYSTEM CARTER THOMASON II (TROCAR) IMPLANT
TIP RUMI ORANGE 6.7MMX12CM (TIP) IMPLANT
TIP UTERINE 5.1X6CM LAV DISP (MISCELLANEOUS) ×3 IMPLANT
TIP UTERINE 6.7X10CM GRN DISP (MISCELLANEOUS) IMPLANT
TIP UTERINE 6.7X6CM WHT DISP (MISCELLANEOUS) ×3 IMPLANT
TIP UTERINE 6.7X8CM BLUE DISP (MISCELLANEOUS) ×3 IMPLANT
TOWEL OR 17X26 10 PK STRL BLUE (TOWEL DISPOSABLE) ×3 IMPLANT
TRENDGUARD 450 HYBRID PRO PACK (MISCELLANEOUS)
TROCAR BLADELESS OPT 5 100 (ENDOMECHANICALS) IMPLANT
TROCAR PORT AIRSEAL 5X120 (TROCAR) ×3 IMPLANT
WATER STERILE IRR 1000ML POUR (IV SOLUTION) ×3 IMPLANT

## 2020-07-22 NOTE — Discharge Summary (Signed)
Physician Discharge Summary  Patient ID: Lindsay Carroll MRN: [id number] DOB/AGE: 11-02-1955 65 y.o.  Admit date: 07/22/2020 Discharge date: 07/22/2020  Admission Diagnoses: Pelvic pain; Post menopausal bleeding.   Discharge Diagnoses:  Principal Problem:   Post-menopausal bleeding Active Problems:   Postoperative state   Pelvic pain   Fibroids, intramural   Laceration of vulva   Post-operative state   Discharged Condition: good  Hospital Course: Patient had an uncomplicated surgery; for further details of this surgery, please refer to the operative note. Furthermore, the patient had an uncomplicated postoperative course.  By time of discharge, her pain was controlled on oral pain medications; she was ambulating, voiding without difficulty, tolerating regular diet and passing flatus.  She was deemed stable for discharge to home.     Consults: None  Significant Diagnostic Studies: labs: CBC and radiology:  [place]  Treatments: surgery: Robot assisted total hysterectomy with bilateral salpingo-oohoperectomy  Discharge Exam: Blood pressure (!) 142/75, pulse 83, temperature 98.1 F (36.7 C), resp. rate 16, height 5\' 4"  (1.626 m), weight 89.8 kg, SpO2 99 %. Pt stable at discharge. Exam unchanged.    Disposition: Discharge disposition: 01-Home or Self Care       Discharge Instructions     Activity as tolerated - No restrictions   Complete by: As directed    Call MD for:  difficulty breathing, headache or visual disturbances   Complete by: As directed    Call MD for:  hives   Complete by: As directed    Call MD for:  persistant dizziness or light-headedness   Complete by: As directed    Call MD for:  persistant nausea and vomiting   Complete by: As directed    Call MD for:  redness, tenderness, or signs of infection (pain, swelling, redness, odor or green/yellow discharge around incision site)   Complete by: As directed    Call MD for:  severe uncontrolled pain   Complete  by: As directed    Call MD for:  temperature >100.4   Complete by: As directed    Diet - low sodium heart healthy   Complete by: As directed    Discharge wound care:   Complete by: As directed    Keep port sites clean and dry.   Driving Restrictions   Complete by: As directed    No driving for 2 weeks.   Increase activity slowly   Complete by: As directed    Lifting restrictions   Complete by: As directed    No heavy lifting for 6 weeks   Sexual Activity Restrictions   Complete by: As directed    Nothing in vagina for 8 full weeks.      Allergies as of 07/22/2020       Reactions   Apple Anaphylaxis, Hives   Pectin         Medication List     TAKE these medications    docusate sodium 100 MG capsule Commonly known as: COLACE Take 1 capsule (100 mg total) by mouth 2 (two) times daily.   ferrous sulfate 325 (65 FE) MG tablet Take 325 mg by mouth daily with breakfast.   ibuprofen 600 MG tablet Commonly known as: ADVIL Take 1 tablet (600 mg total) by mouth every 6 (six) hours as needed for mild pain, moderate pain or cramping. What changed:  medication strength how much to take reasons to take this   multivitamin capsule Take 1 capsule by mouth daily.   oxyCODONE-acetaminophen 5-325 MG  tablet Commonly known as: PERCOCET/ROXICET Take 1 tablet by mouth every 4 (four) hours as needed for moderate pain or severe pain.               Discharge Care Instructions  (From admission, onward)           Start     Ordered   07/22/20 0000  Discharge wound care:       Comments: Keep port sites clean and dry.   07/22/20 1339            Follow-up Information     [facility] In 2 weeks.   Specialty: Internal Medicine Contact information: [address] [place] [phone]                Signed: Lavonia Drafts 07/22/2020, 1:40 PM

## 2020-07-22 NOTE — Telephone Encounter (Signed)
-----   Message from Lavonia Drafts, MD sent at 07/22/2020 10:20 AM EDT ----- Please make pt 2 and 6 weeks post op check.  Thx,  [facility]-S

## 2020-07-22 NOTE — Anesthesia Procedure Notes (Signed)
Procedure Name: Intubation Date/Time: 07/22/2020 7:44 AM Performed by: Justice Rocher, CRNA Pre-anesthesia Checklist: Patient identified, Emergency Drugs available, Suction available, Patient being monitored and Timeout performed Patient Re-evaluated:Patient Re-evaluated prior to induction Oxygen Delivery Method: Circle system utilized Preoxygenation: Pre-oxygenation with 100% oxygen Induction Type: IV induction Ventilation: Mask ventilation without difficulty Laryngoscope Size: Mac and 3 Grade View: Grade II Tube type: Oral Tube size: 7.0 mm Number of attempts: 1 Airway Equipment and Method: Stylet and Oral airway Placement Confirmation: ETT inserted through vocal cords under direct vision,  positive ETCO2,  breath sounds checked- equal and bilateral and CO2 detector Secured at: 23 cm Tube secured with: Tape Dental Injury: Teeth and Oropharynx as per pre-operative assessment

## 2020-07-22 NOTE — Discharge Instructions (Signed)
Total Laparoscopic Hysterectomy, Care After The following information offers guidance on how to care for yourself after your procedure. Your health care provider may also give you more specific instructions. If you have problems or questions, contact your health care provider. What can I expect after the procedure? After the procedure, it is common to have:  Pain, bruising, and numbness around your incisions.  Tiredness (fatigue).  Poor appetite.  Less interest in sex.  Vaginal discharge or bleeding. You will need to use a sanitary pad after this procedure.  Feelings of sadness or other emotions. If your ovaries were also removed, it is also common to have symptoms of menopause, such as hot flashes, night sweats, and lack of sleep (insomnia). Follow these instructions at home: Medicines  Take over-the-counter and prescription medicines only as told by your health care provider.  Ask your health care provider if the medicine prescribed to you: ? Requires you to avoid driving or using machinery. ? Can cause constipation. You may need to take these actions to prevent or treat constipation:  Drink enough fluid to keep your urine pale yellow.  Take over-the-counter or prescription medicines.  Eat foods that are high in fiber, such as beans, whole grains, and fresh fruits and vegetables.  Limit foods that are high in fat and processed sugars, such as fried or sweet foods. Incision care  Follow instructions from your health care provider about how to take care of your incisions. Make sure you: ? Wash your hands with soap and water for at least 20 seconds before and after you change your bandage (dressing). If soap and water are not available, use hand sanitizer. ? Change your dressing as told by your health care provider. ? Leave stitches (sutures), skin glue, or adhesive strips in place. These skin closures may need to stay in place for 2 weeks or longer. If adhesive strip edges start  to loosen and curl up, you may trim the loose edges. Do not remove adhesive strips completely unless your health care provider tells you to do that.  Check your incision areas every day for signs of infection. Check for: ? More redness, swelling, or pain. ? Fluid or blood. ? Warmth. ? Pus or a bad smell.   Activity  Rest as told by your health care provider.  Avoid sitting for a long time without moving. Get up to take short walks every 1-2 hours. This is important to improve blood flow and breathing. Ask for help if you feel weak or unsteady.  Return to your normal activities as told by your health care provider. Ask your health care provider what activities are safe for you.  Do not lift anything that is heavier than 10 lb (4.5 kg), or the limit that you are told, for one month after surgery or until your health care provider says that it is safe.  If you were given a sedative during the procedure, it can affect you for several hours. Do not drive or operate machinery until your health care provider says that it is safe.   Lifestyle  Do not use any products that contain nicotine or tobacco. These products include cigarettes, chewing tobacco, and vaping devices, such as e-cigarettes. These can delay healing after surgery. If you need help quitting, ask your health care provider.  Do not drink alcohol until your health care provider approves. General instructions  Do not douche, use tampons, or have sex for at least 6 weeks, or as told by your health   care provider.  If you struggle with physical or emotional changes after your procedure, speak with your health care provider or a therapist.  Do not take baths, swim, or use a hot tub until your health care provider approves. You may only be allowed to take showers for 2-3 weeks.  Keep your dressing dry until your health care provider says it can be removed.  Try to have someone at home with you for the first 1-2 weeks to help with your  daily chores.  Wear compression stockings as told by your health care provider. These stockings help to prevent blood clots and reduce swelling in your legs.  Keep all follow-up visits. This is important.   Contact a health care provider if:  You have any of these signs of infection: ? Chills or a fever. ? More redness, swelling, or pain around an incision. ? Fluid or blood coming from an incision. ? Warmth coming from an incision. ? Pus or a bad smell coming from an incision.  An incision opens.  You feel dizzy or light-headed.  You have pain or bleeding when you urinate, or you are unable to urinate.  You have abnormal vaginal discharge.  You have pain that does not get better with medicine. Get help right away if:  You have a fever and your symptoms suddenly get worse.  You have severe abdominal pain.  You have chest pain or shortness of breath.  You faint.  You have pain, swelling, or redness in your leg.  You have heavy vaginal bleeding with blood clots, soaking through a sanitary pad in less than 1 hour. These symptoms may represent a serious problem that is an emergency. Do not wait to see if the symptoms will go away. Get medical help right away. Call your local emergency services (911 in the [place] Do not drive yourself to the hospital. Summary  After the procedure, it is common to have pain and bruising around your incisions.  Do not take baths, swim, or use a hot tub until your health care provider approves.  Do not lift anything that is heavier than 10 lb (4.5 kg), or the limit that you are told, for one month after surgery or until your health care provider says that it is safe.  Tell your health care provider if you have any signs or symptoms of infection after the procedure.  Get help right away if you have severe abdominal pain, chest pain, shortness of breath, or heavy bleeding from your vagina. This information is not intended to replace advice given  to you by your health care provider. Make sure you discuss any questions you have with your health care provider. Document Revised: 10/05/2019 Document Reviewed: 10/05/2019 Elsevier Patient Education  2021 Elsevier Inc.  

## 2020-07-22 NOTE — H&P (Signed)
Preoperative History and Physical  Lindsay Carroll is a 65 y.o. G2P0020 here for surgical management of PMPB pelvic pain and fibroids. Pt is s/p  hysteroscopy and D&C with continued bleeding and pain.     Proposed surgery: Robot assisted total laparoscopic hysterectomy with bilateral salpingo-oophorectomy   Past Medical History:  Diagnosis Date  . Anemia due to blood loss, chronic    hx anemia but had hospital admission 04-21-2020 for heavy vaginal bleeding , abd. pain, fatigue, Hg 4.5;  pt transfused 3 units PRBCs, pt discharged 04-23-2020 Hg 8.9;  pt is scheduled for hysteroscopy with myosure 04-30-2020 by Dr Lanny Cramp (gyn)  . PMB (postmenopausal bleeding)    chronic  . Submucous uterine fibroid   . Wears partial dentures    upper only   Past Surgical History:  Procedure Laterality Date  . DILATATION & CURETTAGE/HYSTEROSCOPY WITH MYOSURE N/A 04/30/2020   Procedure: DILATATION & CURETTAGE/HYSTEROSCOPY WITH MYOSURE;  Surgeon: Armandina Stammer, DO;  Location: [facility];  Service: Gynecology;  Laterality: N/A;  . NO PAST SURGERIES     OB History    Gravida  2   Para      Term      Preterm      AB  2   Living        SAB      IAB      Ectopic      Multiple      Live Births             Patient denies any cervical dysplasia or STIs. Medications Prior to Admission  Medication Sig Dispense Refill Last Dose  . ferrous sulfate 325 (65 FE) MG tablet Take 325 mg by mouth daily with breakfast.   Past Week at Unknown time  . megestrol (MEGACE) 40 MG tablet Take 1 tablet (40 mg total) by mouth 2 (two) times daily. Can increase to two tablets twice a day in the event of heavy bleeding (Patient taking differently: Take 40 mg by mouth 2 (two) times daily. TAKES 3 OF 40 MG TABLETS AT ONE TIME IN PM) 60 tablet 5 Past Week at Unknown time  . Multiple Vitamin (MULTIVITAMIN) capsule Take 1 capsule by mouth daily.   07/21/2020 at Unknown time  . ibuprofen (ADVIL) 200 MG  tablet Take 200 mg by mouth every 6 (six) hours as needed.   More than a month at Unknown time    Allergies  Allergen Reactions  . Apple Anaphylaxis and Hives  . Pectin    Social History:   reports that she has never smoked. She has never used smokeless tobacco. She reports previous alcohol use. She reports previous drug use. Family History  Problem Relation Age of Onset  . Cancer Mother 53       colon  . Cancer Father 44       colon    Review of Systems: Noncontributory  PHYSICAL EXAM: Blood pressure (!) 145/100, pulse 97, temperature 98.8 F (37.1 C), temperature source Oral, resp. rate 16, height 5\' 4"  (1.626 m), weight 89.8 kg, SpO2 100 %. General appearance - alert, well appearing, and in no distress Chest - clear to auscultation, no wheezes, rales or rhonchi, symmetric air entry Heart - normal rate and regular rhythm Abdomen - soft, nontender, nondistended, no masses or organomegaly Pelvic -  uterus 10 weeks sized and mobile but, no decensus. Pelvis is very narrow. Pt has some mod vaginismus.     Extremities - peripheral  pulses normal, no pedal edema, no clubbing or cyanosis  Labs: Results for orders placed or performed during the hospital encounter of 07/21/20 (from the past 336 hour(s))  SARS CORONAVIRUS 2 (TAT 6-24 HRS) Nasopharyngeal Nasopharyngeal Swab   Collection Time: 07/21/20 12:31 PM   Specimen: Nasopharyngeal Swab  Result Value Ref Range   SARS Coronavirus 2 NEGATIVE NEGATIVE  Results for orders placed or performed during the hospital encounter of 07/21/20 (from the past 336 hour(s))  CBC   Collection Time: 07/21/20 11:41 AM  Result Value Ref Range   WBC 8.7 4.0 - 10.5 K/uL   RBC 5.69 (H) 3.87 - 5.11 MIL/uL   Hemoglobin 12.6 12.0 - 15.0 g/dL   HCT 42.4 36.0 - 46.0 %   MCV 74.5 (L) 80.0 - 100.0 fL   MCH 22.1 (L) 26.0 - 34.0 pg   MCHC 29.7 (L) 30.0 - 36.0 g/dL   RDW 18.7 (H) 11.5 - 15.5 %   Platelets 335 150 - 400 K/uL   nRBC 0.0 0.0 - 0.2 %  Type and  screen [place]   Collection Time: 07/21/20 11:41 AM  Result Value Ref Range   ABO/RH(D) O POS    Antibody Screen NEG    Sample Expiration      07/24/2020,2359 Performed at [facility], [place] [address], [place], [place] [address]     Imaging Studies: 10/04/2018  Lindsay Carroll is a 65 y.o. G2P0020 she is here for a pelvic sonogram for postmenopausal bleeding.  Uterus                      9. x 6.5 x 6.8 cm, Total uterine volume 210 cc,enlarged heterogeneous anteverted uterus with a 5.8 x 5.4 x 5.7 cm submucosal fibroid  Endometrium          unable to visualized endometrium  Right ovary             2.1 x 1.4 x 2 cm, wnl,limited view   Left ovary                3.1 x 2 x 3.2 cm, wnl,limited view   No free fluid   Technician Comments:  PELVIC US TA/TV:enlarged heterogeneous anteverted uterus with a 5.8 x 5.4 x 5.7 cm submucosal fibroid,unable to visualized endometrium,normal left ovary,normal right ovary (limited view),pelvic pain during ultrasound,no free fluid   Assessment: Patient Active Problem List   Diagnosis Date Noted  . Postoperative state 07/22/2020  . Pelvic pain 07/22/2020  . Post-menopausal bleeding 07/22/2020  . Fibroids, intramural 07/22/2020  . Vaginal bleeding 04/21/2020    Plan: Patient will undergo surgical management with Robot assisted total laparoscopic hysterectomy with bilateral salpingo-oophorectomy.   The risks of surgery were discussed in detail with the patient including but not limited to: bleeding which may require transfusion or reoperation; infection which may require antibiotics; injury to surrounding organs which may involve bowel, bladder, ureters ; need for additional procedures including laparoscopy or laparotomy; thromboembolic phenomenon, surgical site problems and other postoperative/anesthesia complications. Likelihood of success in alleviating the patient's condition was discussed. Routine  postoperative instructions will be reviewed with the patient and her family in detail after surgery.  The patient concurred with the proposed plan, giving informed written consent for the surgery.  Patient has been NPO since last night she will remain NPO for procedure.  Anesthesia and OR aware.  Preoperative prophylactic antibiotics and SCDs ordered on call to the OR.  To OR when ready.  Estle Sabella L. Ihor Dow, M.D., [facility] 07/22/2020 7:20 AM

## 2020-07-22 NOTE — Transfer of Care (Signed)
Immediate Anesthesia Transfer of Care Note  Patient: Lindsay Carroll  Procedure(s) Performed: Procedure(s) (LRB): XI ROBOTIC ASSISTED TOTAL HYSTERECTOMY WITH BILATERAL SALPINGO OOPHORECTOMY, REPAIR OF PERI URETHRAL LACERATION (N/A)  Patient Location: PACU  Anesthesia Type: General  Level of Consciousness: awake, sedated, patient cooperative and responds to stimulation  Airway & Oxygen Therapy: Patient Spontanous Breathing and Patient connected to [place] and soft FM   Post-op Assessment: Report given to PACU RN, Post -op Vital signs reviewed and stable and Patient moving all extremities  Post vital signs: Reviewed and stable  Complications: No apparent anesthesia complications

## 2020-07-22 NOTE — Telephone Encounter (Signed)
Left message on VM informing patient of [facility] [place] appointment with Dr. Eugenie Norrie on 08/04/2020 at 8:15am and 09/03/2020 at 3:00pm.  AVS was printed from hospital with this information.  Patient is aware of follow up appointments.

## 2020-07-22 NOTE — Brief Op Note (Signed)
07/22/2020  10:06 AM  PATIENT:  Ardeth Sportsman  65 y.o. female  PRE-OPERATIVE DIAGNOSIS:  PMB, Pelvic pain  POST-OPERATIVE DIAGNOSIS:  PMB, Pelvic pain  PROCEDURE:  Procedure(s): XI ROBOTIC ASSISTED TOTAL HYSTERECTOMY WITH BILATERAL SALPINGO OOPHORECTOMY, REPAIR OF PERI URETHRAL LACERATION (N/A)  SURGEON:  Surgeon(s) and Role:    * Lavonia Drafts, MD - Primary    * Megan Salon, MD - Assisting  ANESTHESIA:   local and general  EBL:  150 mL   BLOOD ADMINISTERED:none  DRAINS: none   LOCAL MEDICATIONS USED:  MARCAINE     SPECIMEN:  Source of Specimen:  Uterus, cervix, fallopian tubes and ovaries.    DISPOSITION OF SPECIMEN:  PATHOLOGY  COUNTS:  YES  TOURNIQUET:  * No tourniquets in log *  DICTATION: .Note written in [place]: Discharge to home after PACU  PATIENT DISPOSITION:  PACU - hemodynamically stable.   Delay start of Pharmacological VTE agent (>24hrs) due to surgical blood loss or risk of bleeding: not applicable  Complications: none immediate.   Li Bobo L. Harraway-Smith, M.D., [facility]

## 2020-07-22 NOTE — Anesthesia Postprocedure Evaluation (Signed)
Anesthesia Post Note  Patient: Lindsay Carroll  Procedure(s) Performed: XI ROBOTIC ASSISTED TOTAL HYSTERECTOMY WITH BILATERAL SALPINGO OOPHORECTOMY, REPAIR OF PERI URETHRAL LACERATION (N/A Abdomen)     Patient location during evaluation: PACU Anesthesia Type: General Level of consciousness: awake and alert Pain management: pain level controlled Vital Signs Assessment: post-procedure vital signs reviewed and stable Respiratory status: spontaneous breathing, nonlabored ventilation and respiratory function stable Cardiovascular status: blood pressure returned to baseline and stable Postop Assessment: no apparent nausea or vomiting Anesthetic complications: no   No complications documented.  Last Vitals:  Vitals:   07/22/20 1130 07/22/20 1200  BP: (!) 160/81 136/67  Pulse: 85 84  Resp: 14 14  Temp: 36.7 C   SpO2: 95% 95%    Last Pain:  Vitals:   07/22/20 1130  TempSrc:   PainSc: 5                  Candra R Abrar [place]

## 2020-07-22 NOTE — Op Note (Signed)
07/22/2020  10:06 AM  PATIENT:  Lindsay Carroll  65 y.o. female  PRE-OPERATIVE DIAGNOSIS:  PMB, Pelvic pain  POST-OPERATIVE DIAGNOSIS:  PMB, Pelvic pain  PROCEDURE:  Procedure(s): XI ROBOTIC ASSISTED TOTAL HYSTERECTOMY WITH BILATERAL SALPINGO OOPHORECTOMY, REPAIR OF PERI URETHRAL LACERATION (N/A)  SURGEON:  Surgeon(s) and Role:    * Lavonia Drafts, MD - Primary    * Megan Salon, MD - Assisting  ANESTHESIA:   local and general  EBL:  150 mL   BLOOD ADMINISTERED:none  DRAINS: none   LOCAL MEDICATIONS USED:  MARCAINE     SPECIMEN:  Source of Specimen:  Uterus, cervix, fallopian tubes and ovaries.    DISPOSITION OF SPECIMEN:  PATHOLOGY  COUNTS:  YES  TOURNIQUET:  * No tourniquets in log *  DICTATION: .Note written in [place]: Discharge to home after PACU  PATIENT DISPOSITION:  PACU - hemodynamically stable.   Delay start of Pharmacological VTE agent (>24hrs) due to surgical blood loss or risk of bleeding: not applicable  Complications: none immediate.   Pt is a 65 yo nulliparous female with history of post menopausal bleeding and pelvic pain that persist despite hysteroscopy with myomectomy.  Pt here for definitive management of the above.   Findings:  Uterus with fibroids. Normal fallopian tubes and ovaries. There were  Filmy adhesions to the bowel and side walls on the right side.    The risks, benefits, and alternatives of surgery were explained, understood, and accepted. Consents were signed. All questions were answered. She was taken to the operating room and general anesthesia was applied without complication. She was placed in the dorsal lithotomy position and her abdomen and vagina were prepped and draped after she had been carefully positioned on the table. A bimanual exam revealed a 10 week sized uterus that was mobile. Her adnexa were not enlarged. The cervix was measured and the uterus was sounded to 9 cm. A Rumi uterine manipulator was  placed without difficulty. A Foley catheter was placed and it drained clear throughout the case. Gloves were changed and attention was turned to the abdomen. A 50mm incision was made 5 cm above the umbilicus and Optiview with trocar was placed intraperitoneally using direct visualization.  CO2 was used to insufflate the abdomen to approximately 4 L. After good pneumoperitoneum was established. Laparoscopy confirmed correct placement. She was placed in Trendelenburg position and ports were placed in appropriate positions on her abdomen to allow maximum exposure during the robotic case. Specifically there was an 44mm assistant port placed in the left upper quadrant under direct laproscopic visualization. Two 8 mm ports were placed 8cm lateral to the midline port.  These were all placed under direct laparoscopic visualization. The robot was docked and I proceeded with a robotic portion of the case.  The pelvis was inspected and the uterus was found to have fibroids and be enlarged.  The fallopian tubes and ovaries were found to be normal. The remainder of her pelvis appeared normal with the exception of adhesions of bowel to the adnexa and sidewall on the right side.  These were released sharply. The ureters and the infundibulopelvic ligaments were identified and were well out of the operating field. The infundibulopelvic ligament on both sides were grasped and transected using the Vessel Sealer instrument. The round ligament on each side was cauterized and cut. The round ligaments were identified, cauterized and ligated with the Vessel Sealer. The bladder flap was created anteriorly. The uterine vessels were identified  and cauterized and then cut.The bladder was pushed out of the operative site and an anterior colpotomy was made. The colpotomy incision was extended circumferentially, following the blue outline of the Rumi manipulator. All pedicles were hemostatic.  The uterus was removed from the vagina with the  fallopian tube segments. In order to remove the uterus, I had to move to the vagina and morcellate the uterus. This was done using a scalpel and was all external morcellation. After removal of the uterus, fallopain tubes and ovaries, I performed cystoscopy. The cystoscopy revealed blue ejection from both ureters. At this point I went back to the console and closed the vaginal cuff with v-lock suture.  Excellent hemostasis was noted throughout. The pelvis was irrigated. The intraabdominal pressure was lowered assess hemostasis. After determining excellent hemostasis, the robot was undocked.  The skin from all of the ports was closed with 3-0 vicryl. 30cc of 0.5% Marcaine was injected into the port sites.  The patient was then extubated and taken to recovery in stable condition.   Sponge, lap and needle counts were correct x 2.  An experienced assistant was required given the standard of surgical care given the complexity of the case.  This assistant was needed for exposure, dissection, suctioning, retraction, instrument exchange, and for overall help during the procedure.   Shawndell Schillaci L. Harraway-Smith, M.D., Cherlynn June

## 2020-07-23 ENCOUNTER — Encounter (HOSPITAL_BASED_OUTPATIENT_CLINIC_OR_DEPARTMENT_OTHER): Payer: Self-pay | Admitting: Obstetrics & Gynecology

## 2020-07-24 LAB — SURGICAL PATHOLOGY

## 2020-07-25 ENCOUNTER — Telehealth: Payer: Self-pay | Admitting: Obstetrics & Gynecology

## 2020-07-25 NOTE — Telephone Encounter (Signed)
TC to pt to check on her post op. No answer.  Left message.     Stepan Verrette L. Harraway-Smith, M.D., Cherlynn June

## 2020-08-04 ENCOUNTER — Encounter: Payer: BC Managed Care – PPO | Admitting: Obstetrics & Gynecology

## 2020-08-06 ENCOUNTER — Ambulatory Visit (INDEPENDENT_AMBULATORY_CARE_PROVIDER_SITE_OTHER): Payer: BC Managed Care – PPO | Admitting: Obstetrics & Gynecology

## 2020-08-06 ENCOUNTER — Encounter: Payer: Self-pay | Admitting: Obstetrics & Gynecology

## 2020-08-06 ENCOUNTER — Other Ambulatory Visit: Payer: Self-pay

## 2020-08-06 VITALS — BP 178/104 | HR 87 | Wt 198.0 lb

## 2020-08-06 DIAGNOSIS — I10 Essential (primary) hypertension: Secondary | ICD-10-CM

## 2020-08-06 DIAGNOSIS — Z029 Encounter for administrative examinations, unspecified: Secondary | ICD-10-CM

## 2020-08-06 DIAGNOSIS — Z9889 Other specified postprocedural states: Secondary | ICD-10-CM

## 2020-08-06 MED ORDER — LISINOPRIL 5 MG PO TABS: 5.0000 mg | ORAL_TABLET | Freq: Every day | ORAL | 3 refills | Status: DC

## 2020-08-06 NOTE — Patient Instructions (Signed)

## 2020-08-06 NOTE — Progress Notes (Signed)
History:  65 y.o.LMP here today for 2 week post op check.Pt is s/p RATH with bilateral salpingo-oophorectomy on 07/22/2020.  Pt reports that she is doing well. She is eating and passing stools without difficulty.     The following portions of the patient's history were reviewed and updated as appropriate: allergies, current medications, past family history, past medical history, past social history, past surgical history and problem list.  Review of Systems:  Pertinent items are noted in HPI.    Objective:  Physical Exam BP (!) 178/104   Pulse 87   Wt 198 lb (89.8 kg)   LMP  (LMP Unknown)   BMI 33.99 kg/m   CONSTITUTIONAL: Well-developed, well-nourished female in no acute distress.  HENT:  Normocephalic, atraumatic EYES: Conjunctivae and EOM are normal. No scleral icterus.  NECK: Normal range of motion SKIN: Skin is warm and dry. No rash noted. Not diaphoretic.No pallor. [place]: Alert and oriented to person, place, and time. Normal coordination.  Abd: Soft, nontender and nondistended; her port sites are healing well.   Pelvic: deferred  Labs and Imaging Surg path 07/22/2020 UTERUS, CERVIX, BILATERAL FALLOPIAN TUBES AND OVARIES:  - Uterus with leiomyomata, largest measuring 1.6 cm  - Benign inactive endometrium  - Benign endometrial polyp  - Adenomyosis  - Benign cervix with a benign endocervical polyp  - Benign unremarkable bilateral fallopian tubes and ovaries  - No evidence of malignancy    Assessment & Plan:  2 week post op check following RATH with BSO   Doing well  Reviewed her surg path.   Reviewed post op instructions and activities  Gradual increase in activities  F/u in 4 weeks or sooner prn  Reviewed no intercourse for 8 weeks post op  All questions answered.   Elevated BP Pt BPs was elevated on her last visit and in the OR. Will begin meds and refer her to primary care.  Lisinopril 5mg  po daily.     Catricia Scheerer L. Harraway-Smith, M.D., Cherlynn June

## 2020-08-08 ENCOUNTER — Telehealth: Payer: Self-pay

## 2020-08-08 NOTE — Telephone Encounter (Signed)
Called pt to inform her that her FMLA papers are done and ready to be picked up. Left message for pt to call the office back. Devarion Mcclanahan l Yanelle Sousa, CMA

## 2020-09-03 ENCOUNTER — Encounter: Payer: BC Managed Care – PPO | Admitting: Obstetrics & Gynecology

## 2020-09-08 ENCOUNTER — Encounter: Payer: BC Managed Care – PPO | Admitting: Obstetrics & Gynecology

## 2020-09-22 ENCOUNTER — Other Ambulatory Visit: Payer: Self-pay

## 2020-09-22 ENCOUNTER — Encounter: Payer: Self-pay | Admitting: Obstetrics & Gynecology

## 2020-09-22 ENCOUNTER — Ambulatory Visit (INDEPENDENT_AMBULATORY_CARE_PROVIDER_SITE_OTHER): Payer: BC Managed Care – PPO | Admitting: Obstetrics & Gynecology

## 2020-09-22 VITALS — Wt 200.0 lb

## 2020-09-22 DIAGNOSIS — Z9889 Other specified postprocedural states: Secondary | ICD-10-CM

## 2020-09-22 NOTE — Progress Notes (Signed)
Patient is nine weeks post op from [place] with bilateral salpingo-oophorectomy on 07/22/2020 (pt had to reschedule her six week post op). Kathrene Alu RN

## 2020-09-22 NOTE — Progress Notes (Signed)
History:  65 y.o.LMP here today for 2 week post op check.Pt is s/p XI ROBOTIC ASSISTED TOTAL HYSTERECTOMY WITH BILATERAL SALPINGO OOPHORECTOMY, REPAIR OF PERI URETHRAL LACERATION on 07/22/2020  Pt reports that she is doing well. She is eating and passing stools without difficulty.   Pt has returned to work. No issues noted. Her friend and prayer partner recently died.    The following portions of the patient's history were reviewed and updated as appropriate: allergies, current medications, past family history, past medical history, past social history, past surgical history and problem list.  Review of Systems:  Pertinent items are noted in HPI.    Objective:  Physical Exam Wt 200 lb (90.7 kg)   LMP  (LMP Unknown)   BMI 34.33 kg/m   CONSTITUTIONAL: Well-developed, well-nourished female in no acute distress.  HENT:  Normocephalic, atraumatic EYES: Conjunctivae and EOM are normal. No scleral icterus.  NECK: Normal range of motion SKIN: Skin is warm and dry. No rash noted. Not diaphoretic.No pallor. [place]: Alert and oriented to person, place, and time. Normal coordination.  Abd: Soft, nontender and nondistended; her port sites are healing well. Sutures removed from port sites.   Pelvic: cuff well healed. NO masses noted.   Labs and Imaging Surg path6/08/2020 FINAL MICROSCOPIC DIAGNOSIS:   A. UTERUS, CERVIX, BILATERAL FALLOPIAN TUBES AND OVARIES:  - Uterus with leiomyomata, largest measuring 1.6 cm  - Benign inactive endometrium  - Benign endometrial polyp  - Adenomyosis  - Benign cervix with a benign endocervical polyp  - Benign unremarkable bilateral fallopian tubes and ovaries  - No evidence of malignancy    Assessment & Plan:  9 week post op check following RATH with bilateral salpingectomy.   Doing well  Reviewed her surg path.   Reviewed post op instructions and activities  Gradual increase in activities  F/u in 4 weeks or sooner prn  May resume intercourse   All  questions answered.   Josemanuel Eakins L. Harraway-Smith, M.D., Cherlynn June

## 2020-11-19 ENCOUNTER — Ambulatory Visit (INDEPENDENT_AMBULATORY_CARE_PROVIDER_SITE_OTHER): Payer: PRIVATE HEALTH INSURANCE | Admitting: Obstetrics & Gynecology

## 2020-11-19 ENCOUNTER — Other Ambulatory Visit: Payer: Self-pay

## 2020-11-19 ENCOUNTER — Encounter: Payer: Self-pay | Admitting: Obstetrics & Gynecology

## 2020-11-19 VITALS — BP 139/63 | HR 88 | Wt 202.0 lb

## 2020-11-19 DIAGNOSIS — N951 Menopausal and female climacteric states: Secondary | ICD-10-CM | POA: Diagnosis not present

## 2020-11-19 NOTE — Progress Notes (Signed)
4 months post op RATH. Follow up. Kathrene Alu RN

## 2020-11-24 ENCOUNTER — Encounter: Payer: Self-pay | Admitting: Obstetrics & Gynecology

## 2020-11-24 NOTE — Progress Notes (Signed)
History:  65 y.o.LMP here today for 3 months post op check.Pt is s/p Pt is s/p XI ROBOTIC ASSISTED TOTAL HYSTERECTOMY WITH BILATERAL SALPINGO OOPHORECTOMY, REPAIR OF PERI URETHRAL LACERATION on 07/22/2020.  Pt reports that she is doing well. She is eating and passing stools without difficulty.   Pt reports that she still has hot flashes and sweats in her scalp.   The following portions of the patient's history were reviewed and updated as appropriate: allergies, current medications, past family history, past medical history, past social history, past surgical history and problem list.  Review of Systems:  Pertinent items are noted in HPI.    Objective:  Physical Exam BP 139/63   Pulse 88   Wt 202 lb (91.6 kg)   LMP  (LMP Unknown)   BMI 34.67 kg/m   CONSTITUTIONAL: Well-developed, well-nourished female in no acute distress.  HENT:  Normocephalic, atraumatic EYES: Conjunctivae and EOM are normal. No scleral icterus.  NECK: Normal range of motion SKIN: Skin is warm and dry. No rash noted. Not diaphoretic.No pallor. [place]: Alert and oriented to person, place, and time. Normal coordination.  Abd: Soft, nontender and nondistended; her port sites are healing well. .  Pelvic: deferred   Assessment & Plan:  3 months post op check following Pt is s/p XI ROBOTIC ASSISTED TOTAL HYSTERECTOMY WITH BILATERAL SALPINGO OOPHORECTOMY, REPAIR OF PERI URETHRAL LACERATION on 07/22/2020. Pt cont to have hot flushes. No meds needed at this time.     Doing well  F/u in 1 year or sooner prn  All questions answered.   Total face-to-face time with patient, review of chart, discussion with consultant and coordination of care was 31min.    Gwendelyn Lanting L. Harraway-Smith, M.D., Cherlynn June

## 2021-11-12 ENCOUNTER — Ambulatory Visit: Payer: PRIVATE HEALTH INSURANCE | Admitting: Family

## 2021-11-12 ENCOUNTER — Ambulatory Visit (HOSPITAL_BASED_OUTPATIENT_CLINIC_OR_DEPARTMENT_OTHER)
Admission: RE | Admit: 2021-11-12 | Discharge: 2021-11-12 | Disposition: A | Discharge status: Home / Self Care | Payer: PRIVATE HEALTH INSURANCE | Source: Ambulatory Visit | Attending: Family | Admitting: Family

## 2021-11-12 ENCOUNTER — Encounter: Payer: Self-pay | Admitting: Family

## 2021-11-12 VITALS — BP 110/70 | HR 78 | Temp 98.6°F | Ht 64.0 in | Wt 215.8 lb

## 2021-11-12 DIAGNOSIS — Z1231 Encounter for screening mammogram for malignant neoplasm of breast: Secondary | ICD-10-CM | POA: Diagnosis not present

## 2021-11-12 DIAGNOSIS — Z1211 Encounter for screening for malignant neoplasm of colon: Secondary | ICD-10-CM | POA: Diagnosis not present

## 2021-11-12 DIAGNOSIS — R202 Paresthesia of skin: Secondary | ICD-10-CM

## 2021-11-12 DIAGNOSIS — M5442 Lumbago with sciatica, left side: Secondary | ICD-10-CM | POA: Diagnosis not present

## 2021-11-12 DIAGNOSIS — Z13220 Encounter for screening for lipoid disorders: Secondary | ICD-10-CM | POA: Diagnosis not present

## 2021-11-12 DIAGNOSIS — R7309 Other abnormal glucose: Secondary | ICD-10-CM

## 2021-11-12 DIAGNOSIS — R200 Anesthesia of skin: Secondary | ICD-10-CM

## 2021-11-12 LAB — CBC WITH DIFFERENTIAL/PLATELET
Basophils Absolute: 0.1 10*3/uL (ref 0.0–0.1)
Basophils Relative: 0.6 % (ref 0.0–3.0)
Eosinophils Absolute: 0.1 10*3/uL (ref 0.0–0.7)
Eosinophils Relative: 0.8 % (ref 0.0–5.0)
HCT: 40.7 % (ref 36.0–46.0)
Hemoglobin: 13.3 g/dL (ref 12.0–15.0)
Lymphocytes Relative: 22.9 % (ref 12.0–46.0)
Lymphs Abs: 2 10*3/uL (ref 0.7–4.0)
MCHC: 32.7 g/dL (ref 30.0–36.0)
MCV: 86.7 fl (ref 78.0–100.0)
Monocytes Absolute: 0.7 10*3/uL (ref 0.1–1.0)
Monocytes Relative: 7.8 % (ref 3.0–12.0)
Neutro Abs: 6.1 10*3/uL (ref 1.4–7.7)
Neutrophils Relative %: 67.9 % (ref 43.0–77.0)
Platelets: 284 10*3/uL (ref 150.0–400.0)
RBC: 4.69 Mil/uL (ref 3.87–5.11)
RDW: 13.2 % (ref 11.5–15.5)
WBC: 8.9 10*3/uL (ref 4.0–10.5)

## 2021-11-12 LAB — COMPREHENSIVE METABOLIC PANEL
ALT: 46 U/L — ABNORMAL HIGH (ref 0–35)
AST: 34 U/L (ref 0–37)
Albumin: 4.3 g/dL (ref 3.5–5.2)
Alkaline Phosphatase: 81 U/L (ref 39–117)
BUN: 12 mg/dL (ref 6–23)
CO2: 30 mEq/L (ref 19–32)
Calcium: 9.6 mg/dL (ref 8.4–10.5)
Chloride: 99 mEq/L (ref 96–112)
Creatinine, Ser: 0.67 mg/dL (ref 0.40–1.20)
GFR: 91.83 mL/min (ref >60.00)
Glucose, Bld: 193 mg/dL — ABNORMAL HIGH (ref 70–99)
Potassium: 4 mEq/L (ref 3.5–5.1)
Sodium: 138 mEq/L (ref 135–145)
Total Bilirubin: 0.7 mg/dL (ref 0.2–1.2)
Total Protein: 7.5 g/dL (ref 6.0–8.3)

## 2021-11-12 LAB — LIPID PANEL
Cholesterol: 197 mg/dL (ref 0–200)
HDL: 47.8 mg/dL (ref >39.00)
LDL Cholesterol: 128 mg/dL — ABNORMAL HIGH (ref 0–99)
NonHDL: 149.64
Total CHOL/HDL Ratio: 4
Triglycerides: 108 mg/dL (ref 0.0–149.0)
VLDL: 21.6 mg/dL (ref 0.0–40.0)

## 2021-11-12 LAB — HEMOGLOBIN A1C: Hgb A1c MFr Bld: 8.1 % — ABNORMAL HIGH (ref 4.6–6.5)

## 2021-11-12 LAB — VITAMIN B12: Vitamin B-12: 586 pg/mL (ref 211–911)

## 2021-11-12 LAB — TSH: TSH: 2.31 u[IU]/mL (ref 0.35–5.50)

## 2021-11-12 MED ORDER — EPINEPHRINE 0.3 MG/0.3ML IJ SOAJ: 0.3000 mg | INTRAMUSCULAR | 1 refills | Status: AC | PRN

## 2021-11-12 NOTE — Progress Notes (Signed)
Lindsay Carroll is a 66 y.o. female with the following history as recorded in [employer]:  Patient Active Problem List   Diagnosis Date Noted   Postoperative state 07/22/2020   Pelvic pain 07/22/2020   Post-menopausal bleeding 07/22/2020   Fibroids, intramural 07/22/2020   Laceration of vulva 07/22/2020   Post-operative state 07/22/2020   Vaginal bleeding 04/21/2020    Current Outpatient Medications  Medication Sig Dispense Refill   EPINEPHrine 0.3 mg/0.3 mL IJ SOAJ injection Inject 0.3 mg into the muscle as needed for anaphylaxis. 1 each 1   No current facility-administered medications for this visit.    Allergies: Apple juice and Pectin  Past Medical History:  Diagnosis Date   Anemia due to blood loss, chronic    hx anemia but had hospital admission 04-21-2020 for heavy vaginal bleeding , abd. pain, fatigue, Hg 4.5;  pt transfused 3 units PRBCs, pt discharged 04-23-2020 Hg 8.9;  pt is scheduled for hysteroscopy with myosure 04-30-2020 by Dr Lanny Cramp (gyn)   PMB (postmenopausal bleeding)    chronic   Submucous uterine fibroid    Wears partial dentures    upper only    Past Surgical History:  Procedure Laterality Date   DILATATION & CURETTAGE/HYSTEROSCOPY WITH MYOSURE N/A 04/30/2020   Procedure: [place];  Surgeon: Armandina Stammer, DO;  Location: [facility];  Service: Gynecology;  Laterality: N/A;   NO PAST SURGERIES     ROBOTIC ASSISTED TOTAL HYSTERECTOMY WITH BILATERAL SALPINGO OOPHERECTOMY N/A 07/22/2020   Procedure: XI ROBOTIC ASSISTED TOTAL HYSTERECTOMY WITH BILATERAL SALPINGO OOPHORECTOMY, REPAIR OF PERI URETHRAL LACERATION;  Surgeon: Lavonia Drafts, MD;  Location: [place];  Service: Gynecology;  Laterality: N/A;    Family History  Problem Relation Age of Onset   Cancer Mother 53       colon   Cancer Father 43       colon    Social History   Tobacco Use   Smoking status: Never    Smokeless tobacco: Never  Substance Use Topics   Alcohol use: Not Currently    Comment: 04-28-2020  per pt last quit alcohol 1998    Subjective:   Presents today as a new patient; complaining of left sided pelvic pain/ left leg pain x 5 days; feels pain radiating from groin down into foot;  Had total hysterectomy in 2022;  Went to the [facility] on Monday with these symptoms- was given Rx for prednisone but has not started yet;   Has not seen primary care "for a while"- overdue for preventive care needs.     Objective:  Vitals:   11/12/21 1031  BP: 110/70  Pulse: 78  Temp: 98.6 F (37 C)  TempSrc: Oral  SpO2: 97%  Weight: 215 lb 12.8 oz (97.9 kg)  Height: 5' 4"  (1.626 m)    General: Well developed, well nourished, in no acute distress  Skin : Warm and dry.  Head: Normocephalic and atraumatic  Eyes: Sclera and conjunctiva clear; pupils round and reactive to light; extraocular movements intact  Ears: External normal; canals clear; tympanic membranes normal  Oropharynx: Pink, supple. No suspicious lesions  Neck: Supple without thyromegaly, adenopathy  Lungs: Respirations unlabored; clear to auscultation bilaterally without wheeze, rales, rhonchi  CVS exam: normal rate and regular rhythm.  Abdomen: Soft; nontender; nondistended; normoactive bowel sounds; no masses or hepatosplenomegaly  Musculoskeletal: No deformities; no active joint inflammation  Extremities: No edema, cyanosis, clubbing  Vessels: Symmetric bilaterally  Neurologic: Alert  and oriented; speech intact; face symmetrical; moves all extremities well; CNII-XII intact without focal deficit   Assessment:  1. Encounter for screening colonoscopy   2. Visit for screening mammogram   3. Acute left-sided low back pain with left-sided sciatica   4. Numbness and tingling   5. Lipid screening   6. Elevated glucose     Plan:  Order updated; Order updated; Already has prednisone from [facility] visit earlier this week and is  planning to start tomorrow; will update X-ray; to consider PT- patient does drive for extended periods of time.  Check B12, TSH today; Check lipid screening; Check Hgba1c;   Follow up to be determined;   No follow-ups on file.  Orders Placed This Encounter  Procedures   MM Digital Screening    Order Specific Question:   Reason for Exam (SYMPTOM  OR DIAGNOSIS REQUIRED)    Answer:   screening mammogram    Order Specific Question:   Preferred imaging location?    Answer:   [facility]   DG Lumbar Spine Complete    Standing Status:   Future    Number of Occurrences:   1    Standing Expiration Date:   11/13/2022    Order Specific Question:   Reason for Exam (SYMPTOM  OR DIAGNOSIS REQUIRED)    Answer:   low back pain    Order Specific Question:   Preferred imaging location?    Answer:   [facility]   CBC with Differential/Platelet   Comp Met (CMET)   Lipid panel   TSH   Hemoglobin A1c   B12   Ambulatory referral to Gastroenterology    Referral Priority:   Routine    Referral Type:   Consultation    Referral Reason:   Specialty Services Required    Number of Visits Requested:   1    Requested Prescriptions   Signed Prescriptions Disp Refills   EPINEPHrine 0.3 mg/0.3 mL IJ SOAJ injection 1 each 1    Sig: Inject 0.3 mg into the muscle as needed for anaphylaxis.

## 2021-11-16 ENCOUNTER — Other Ambulatory Visit: Payer: Self-pay | Admitting: Family

## 2021-11-16 DIAGNOSIS — E119 Type 2 diabetes mellitus without complications: Secondary | ICD-10-CM

## 2021-11-16 MED ORDER — METFORMIN HCL 500 MG PO TABS: ORAL_TABLET | ORAL | 0 refills | Status: DC

## 2021-11-17 ENCOUNTER — Telehealth: Payer: Self-pay | Admitting: Family

## 2021-11-17 NOTE — Telephone Encounter (Signed)
Patient calling to find out if she is Type 1 or Type 2 diabetic. Please call to advise.

## 2021-11-17 NOTE — Telephone Encounter (Signed)
I have called the pt and informed her that she has type 2 DM and she stated understanding.

## 2021-12-07 ENCOUNTER — Inpatient Hospital Stay (HOSPITAL_BASED_OUTPATIENT_CLINIC_OR_DEPARTMENT_OTHER): Admission: RE | Admit: 2021-12-07 | Payer: PRIVATE HEALTH INSURANCE | Source: Ambulatory Visit

## 2021-12-14 ENCOUNTER — Ambulatory Visit (HOSPITAL_BASED_OUTPATIENT_CLINIC_OR_DEPARTMENT_OTHER): Payer: PRIVATE HEALTH INSURANCE

## 2021-12-16 ENCOUNTER — Encounter: Payer: Self-pay | Admitting: Dietician

## 2021-12-16 ENCOUNTER — Encounter: Payer: PRIVATE HEALTH INSURANCE | Attending: Family | Admitting: Dietician

## 2021-12-16 DIAGNOSIS — E119 Type 2 diabetes mellitus without complications: Secondary | ICD-10-CM | POA: Diagnosis not present

## 2021-12-16 NOTE — Patient Instructions (Addendum)
Aim for 150 minutes of physical activity weekly. -consider walking 15 minutes after meals when possible -start going on walks: there's lots of parks in [place] to walk at!  Eat more Non-Starchy Vegetables - aim to make 1/2 of your plate non-starchy vegetables at least 2 meals per day.  Schedule your diabetic eye exam.  Minimize added sugars and refined grains. Rethink what you drink. Choose beverages without added sugar. Look for 0 carbs on the label. -limit your soda intake, try sparkling water instead, or diet soda or crystal light.   Choose whole foods over processed.  Make simple meals at home more often than eating out.  Aim to eat within 1-2 hours of waking up and every 3-5 hours following.   Take your medication as prescribed.

## 2021-12-16 NOTE — Progress Notes (Signed)
Diabetes Self-Management Education  Visit Type: First/Initial  Appt. Start Time: 1112 Appt. End Time: 1214  12/16/2021  Ms. Lindsay Carroll, identified by name and date of birth, is a 66 y.o. female with a diagnosis of Diabetes: Type 2.   ASSESSMENT   Primary concern: Pt states she was recently diagnosed with diabetes and she is wondering how she developed it and how she should be eating. She states there is a lot of contradiction about food and diabetes on the internet.  History includes: anemia, type 2 diabetes Labs noted: 11/12/21 A1C 8.1% Medications include: metformin Supplements: none Food allergies: apples and pectin   Patient lives alone and does the shopping and cooking. Pt states she goes to eat more often than eating at home.   Pt states she very seldom eats breakfast. She states she sometimes doesn't eat until 2pm and does not get hungry before.  Pt states she usually eats a meat, starch, and a green vegetable at dinner.   Pt drinks 25oz beer nightly and states this helps her sleep.    Pt states she has not picked up her metformin from the pharmacy and has not started taking it.   Pt works in [place] and has to drive back and forth between [place] a lot which she states causes her to eat out more.   Height '5\' 4"'$  (1.626 m), weight 210 lb 3.2 oz (95.3 kg). Body mass index is 36.08 kg/m.   Diabetes Self-Management Education - 12/16/21 1117       Visit Information   Visit Type First/Initial      Initial Visit   Diabetes Type Type 2    Date Diagnosed 11/2021    Are you currently following a meal plan? No    Are you taking your medications as prescribed? Yes      Health Coping   How would you rate your overall health? Good      Psychosocial Assessment   Patient Belief/Attitude about Diabetes Motivated to manage diabetes    What is the hardest part about your diabetes right now, causing you the most concern, or is the most worrisome to you  about your diabetes?   Making healty food and beverage choices    Self-care barriers None    Self-management support Doctor's office    Other persons present Patient    Patient Concerns Healthy Lifestyle    Special Needs None    Preferred Learning Style No preference indicated    Learning Readiness Ready    How often do you need to have someone help you when you read instructions, pamphlets, or other written materials from your doctor or pharmacy? 1 - Never    What is the last grade level you completed in school? masters      Pre-Education Assessment   Patient understands the diabetes disease and treatment process. Needs Instruction    Patient understands incorporating nutritional management into lifestyle. Needs Instruction    Patient undertands incorporating physical activity into lifestyle. Needs Instruction    Patient understands using medications safely. Needs Instruction    Patient understands monitoring blood glucose, interpreting and using results Needs Instruction    Patient understands prevention, detection, and treatment of acute complications. Needs Instruction    Patient understands prevention, detection, and treatment of chronic complications. Needs Instruction    Patient understands how to develop strategies to address psychosocial issues. Needs Instruction    Patient understands how to develop strategies to promote health/change behavior. Needs Instruction  Complications   Last HgB A1C per patient/outside source 8.1 %    How often do you check your blood sugar? Not recommended by provider    Have you had a dilated eye exam in the past 12 months? No    Have you had a dental exam in the past 12 months? Yes    Are you checking your feet? No      Dietary Intake   Breakfast skips    Snack (morning) none    Lunch 1:30pm: cookout hamburger and fries with coke or water OR tuna sandwich [place] mikes OR 2 hot dogs all the way 2x/wk    Snack (afternoon) none    Dinner wings  with fries OR japanese hibachi salmon with steak, rice, and veggies OR olive garden fettucine alfredo with breadsticks and salad    Snack (evening) popcorn OR chips OR none    Beverage(s) 25oz beer, water 24-48oz, mellow yellow or coke 1x/day.      Activity / Exercise   Activity / Exercise Type ADL's    How many days per week do you exercise? 1    How many minutes per day do you exercise? 15    Total minutes per week of exercise 15      Patient Education   Previous Diabetes Education No    Disease Pathophysiology Definition of diabetes, type 1 and 2, and the diagnosis of diabetes;Factors that contribute to the development of diabetes;Explored patient's options for treatment of their diabetes    Healthy Eating Role of diet in the treatment of diabetes and the relationship between the three main macronutrients and blood glucose level;Food label reading, portion sizes and measuring food.;Plate Method;Carbohydrate counting;Reviewed blood glucose goals for pre and post meals and how to evaluate the patients' food intake on their blood glucose level.;Meal timing in regards to the patients' current diabetes medication.;Effects of alcohol on blood glucose and safety factors with consumption of alcohol.;Information on hints to eating out and maintain blood glucose control.;Meal options for control of blood glucose level and chronic complications.    Being Active Role of exercise on diabetes management, blood pressure control and cardiac health.;Helped patient identify appropriate exercises in relation to his/her diabetes, diabetes complications and other health issue.    Medications Reviewed patients medication for diabetes, action, purpose, timing of dose and side effects.;Reviewed medication adjustment guidelines for hyperglycemia and sick days.    Monitoring Identified appropriate SMBG and/or A1C goals.;Daily foot exams;Yearly dilated eye exam    Acute complications Taught prevention, symptoms, and   treatment of hypoglycemia - the 15 rule.;Discussed and identified patients' prevention, symptoms, and treatment of hyperglycemia.    Chronic complications Relationship between chronic complications and blood glucose control;Assessed and discussed foot care and prevention of foot problems;Lipid levels, blood glucose control and heart disease;Identified and discussed with patient  current chronic complications;Dental care;Retinopathy and reason for yearly dilated eye exams;Reviewed with patient heart disease, higher risk of, and prevention;Nephropathy, what it is, prevention of, the use of ACE, ARB's and early detection of through urine microalbumia.    Diabetes Stress and Support Role of stress on diabetes;Worked with patient to identify barriers to care and solutions;Identified and addressed patients feelings and concerns about diabetes    Lifestyle and Health Coping Lifestyle issues that need to be addressed for better diabetes care      Individualized Goals (developed by patient)   Nutrition General guidelines for healthy choices and portions discussed    Physical Activity Exercise 3-5 times per  week;30 minutes per day    Medications take my medication as prescribed    Monitoring  Not Applicable    Problem Solving Eating Pattern    Reducing Risk examine blood glucose patterns;do foot checks daily;treat hypoglycemia with 15 grams of carbs if blood glucose less than '70mg'$ /dL    Health Coping Ask for help with psychological, social, or emotional issues      Post-Education Assessment   Patient understands the diabetes disease and treatment process. Comprehends key points    Patient understands incorporating nutritional management into lifestyle. Comprehends key points    Patient undertands incorporating physical activity into lifestyle. Comprehends key points    Patient understands using medications safely. Comphrehends key points    Patient understands monitoring blood glucose, interpreting and  using results Comprehends key points    Patient understands prevention, detection, and treatment of acute complications. Comprehends key points    Patient understands prevention, detection, and treatment of chronic complications. Comprehends key points    Patient understands how to develop strategies to address psychosocial issues. Comprehends key points    Patient understands how to develop strategies to promote health/change behavior. Comprehends key points      Outcomes   Expected Outcomes Demonstrated interest in learning. Expect positive outcomes    Future DMSE 4-6 wks    Program Status Not Completed             Individualized Plan for Diabetes Self-Management Training:   Learning Objective:  Patient will have a greater understanding of diabetes self-management. Patient education plan is to attend individual and/or group sessions per assessed needs and concerns.   Plan:   Patient Instructions  Aim for 150 minutes of physical activity weekly. -consider walking 15 minutes after meals when possible -start going on walks: there's lots of parks in [place] to walk at!  Eat more Non-Starchy Vegetables - aim to make 1/2 of your plate non-starchy vegetables at least 2 meals per day.  Schedule your diabetic eye exam.  Minimize added sugars and refined grains. Rethink what you drink. Choose beverages without added sugar. Look for 0 carbs on the label. -limit your soda intake, try sparkling water instead, or diet soda or crystal light.   Choose whole foods over processed.  Make simple meals at home more often than eating out.  Aim to eat within 1-2 hours of waking up and every 3-5 hours following.   Take your medication as prescribed.    Expected Outcomes:  Demonstrated interest in learning. Expect positive outcomes  Education material provided: ADA - How to Thrive: A Guide for Your Journey with Diabetes, My Plate, and Snack sheet  If problems or questions, patient to  contact team via:  Phone  Future DSME appointment: 4-6 wks

## 2022-01-19 ENCOUNTER — Telehealth (HOSPITAL_BASED_OUTPATIENT_CLINIC_OR_DEPARTMENT_OTHER): Payer: Self-pay

## 2022-01-19 ENCOUNTER — Ambulatory Visit: Payer: PRIVATE HEALTH INSURANCE | Admitting: Family

## 2022-02-01 ENCOUNTER — Ambulatory Visit: Payer: PRIVATE HEALTH INSURANCE | Admitting: Dietician

## 2022-03-24 ENCOUNTER — Other Ambulatory Visit: Payer: Self-pay | Admitting: Family

## 2022-03-24 ENCOUNTER — Telehealth: Payer: Self-pay | Admitting: Family

## 2022-03-24 NOTE — Telephone Encounter (Signed)
Spoke with Pt she states she is supposed to start "training" this coming up week and would like to call the office back at a later date to schedule appt.

## 2022-03-24 NOTE — Telephone Encounter (Signed)
She needs OV; was due for OV in December and did not keep that appointment; can only give 1 month refill on Metformin.

## 2022-04-09 ENCOUNTER — Encounter: Payer: Self-pay | Admitting: Family

## 2022-04-09 ENCOUNTER — Ambulatory Visit: Payer: PRIVATE HEALTH INSURANCE | Admitting: Family

## 2022-04-09 VITALS — BP 160/90 | HR 85 | Resp 18 | Ht 64.0 in | Wt 210.0 lb

## 2022-04-09 DIAGNOSIS — E119 Type 2 diabetes mellitus without complications: Secondary | ICD-10-CM

## 2022-04-09 DIAGNOSIS — M25532 Pain in left wrist: Secondary | ICD-10-CM

## 2022-04-09 DIAGNOSIS — R030 Elevated blood-pressure reading, without diagnosis of hypertension: Secondary | ICD-10-CM

## 2022-04-09 MED ORDER — MELOXICAM 15 MG PO TABS: 15.0000 mg | ORAL_TABLET | Freq: Every day | ORAL | 0 refills | Status: DC

## 2022-04-09 NOTE — Progress Notes (Signed)
Lindsay Carroll is a 67 y.o. female with the following history as recorded in EpicCare:  Patient Active Problem List   Diagnosis Date Noted   Postoperative state 07/22/2020   Pelvic pain 07/22/2020   Post-menopausal bleeding 07/22/2020   Fibroids, intramural 07/22/2020   Laceration of vulva 07/22/2020   Post-operative state 07/22/2020   Vaginal bleeding 04/21/2020    Current Outpatient Medications  Medication Sig Dispense Refill   EPINEPHrine 0.3 mg/0.3 mL IJ SOAJ injection Inject 0.3 mg into the muscle as needed for anaphylaxis. 1 each 1   meloxicam (MOBIC) 15 MG tablet Take 1 tablet (15 mg total) by mouth daily. 30 tablet 0   metFORMIN (GLUCOPHAGE) 500 MG tablet Take 1 tablet (500 mg total) by mouth 2 (two) times daily with a meal. 60 tablet 0   No current facility-administered medications for this visit.    Allergies: Apple, Apple juice, and Pectin  Past Medical History:  Diagnosis Date   Anemia due to blood loss, chronic    hx anemia but had hospital admission 04-21-2020 for heavy vaginal bleeding , abd. pain, fatigue, Hg 4.5;  pt transfused 3 units PRBCs, pt discharged 04-23-2020 Hg 8.9;  pt is scheduled for hysteroscopy with myosure 04-30-2020 by Dr Lanny Cramp (gyn)   PMB (postmenopausal bleeding)    chronic   Submucous uterine fibroid    Wears partial dentures    upper only    Past Surgical History:  Procedure Laterality Date   DILATATION & CURETTAGE/HYSTEROSCOPY WITH MYOSURE N/A 04/30/2020   Procedure: Coal;  Surgeon: Armandina Stammer, DO;  Location: [facility];  Service: Gynecology;  Laterality: N/A;   NO PAST SURGERIES     ROBOTIC ASSISTED TOTAL HYSTERECTOMY WITH BILATERAL SALPINGO OOPHERECTOMY N/A 07/22/2020   Procedure: XI ROBOTIC ASSISTED TOTAL HYSTERECTOMY WITH BILATERAL SALPINGO OOPHORECTOMY, REPAIR OF PERI URETHRAL LACERATION;  Surgeon: Lavonia Drafts, MD;  Location: [facility];   Service: Gynecology;  Laterality: N/A;    Family History  Problem Relation Age of Onset   Cancer Mother 50       colon   Cancer Father 63       colon    Social History   Tobacco Use   Smoking status: Never   Smokeless tobacco: Never  Substance Use Topics   Alcohol use: Not Currently    Comment: 04-28-2020  per pt last quit alcohol 1998    Subjective:   Left wrist pain/ arm pain x 7 days; asking for referral to orthopedist- wants to be seen at an office that is open after 5 pm; no known injury or trauma; just woke up one morning with the pain;   Has not been seen since diagnosed with Type 2 Diabetes at the end of September- has not been taking her Metformin regularly; has been working on [employer] changes; is surprised to see that her blood pressure is elevated;   Objective:  Vitals:   04/09/22 1309 04/09/22 1336  BP: (!) 160/92 (!) 160/90  Pulse: 85   Resp: 18   SpO2: 98%   Weight: 210 lb (95.3 kg)   Height: '5\' 4"'$  (1.626 m)     General: Well developed, well nourished, in no acute distress  Skin : Warm and dry.  Head: Normocephalic and atraumatic  Eyes: Sclera and conjunctiva clear; pupils round and reactive to light; extraocular movements intact  Ears: External normal; canals clear; tympanic membranes normal  Oropharynx: Pink, supple. No suspicious lesions  Neck: Supple without thyromegaly, adenopathy  Lungs: Respirations unlabored; clear to auscultation bilaterally without wheeze, rales, rhonchi  CVS exam: normal rate and regular rhythm.  Abdomen: Soft; nontender; nondistended; normoactive bowel sounds; no masses or hepatosplenomegaly  Musculoskeletal: No deformities; no active joint inflammation  Extremities: No edema, cyanosis, clubbing  Vessels: Symmetric bilaterally  Neurologic: Alert and oriented; speech intact; face symmetrical; moves all extremities well; CNII-XII intact without focal deficit   Assessment:  1. Left wrist pain   2. Type 2 diabetes mellitus  without complication, without long-term current use of insulin (HCC)   3. Elevated blood pressure reading     Plan:  Trial of Mobic 15 mg daily; refer to orthopedist;  Update labs today; patient has not been taking her Metformin regularly; follow up to be determined;  Patient is asked to get home blood pressure cuff and start checking her readings regularly- we will plan to get information from her next week- to consider starting medication.   No follow-ups on file.  Orders Placed This Encounter  Procedures   CBC with Differential/Platelet   Comp Met (CMET)   Hemoglobin A1c   Ambulatory referral to Orthopedics    Referral Priority:   Routine    Referral Type:   Consultation    Number of Visits Requested:   1    Requested Prescriptions   Signed Prescriptions Disp Refills   meloxicam (MOBIC) 15 MG tablet 30 tablet 0    Sig: Take 1 tablet (15 mg total) by mouth daily.

## 2022-04-09 NOTE — Patient Instructions (Signed)
Please purchase a home blood pressure cuff and start checking your blood pressure regularly; we would like to see you blood pressure at or below 130/ 80.

## 2022-04-10 LAB — CBC WITH DIFFERENTIAL/PLATELET
Absolute Monocytes: 662 cells/uL (ref 200–950)
Basophils Absolute: 39 cells/uL (ref 0–200)
Basophils Relative: 0.5 %
Eosinophils Absolute: 69 cells/uL (ref 15–500)
Eosinophils Relative: 0.9 %
HCT: 41.5 % (ref 35.0–45.0)
Hemoglobin: 13.7 g/dL (ref 11.7–15.5)
Lymphs Abs: 2056 cells/uL (ref 850–3900)
MCH: 27.6 pg (ref 27.0–33.0)
MCHC: 33 g/dL (ref 32.0–36.0)
MCV: 83.5 fL (ref 80.0–100.0)
MPV: 12 fL (ref 7.5–12.5)
Monocytes Relative: 8.6 %
Neutro Abs: 4874 cells/uL (ref 1500–7800)
Neutrophils Relative %: 63.3 %
Platelets: 312 10*3/uL (ref 140–400)
RBC: 4.97 10*6/uL (ref 3.80–5.10)
RDW: 12.8 % (ref 11.0–15.0)
Total Lymphocyte: 26.7 %
WBC: 7.7 10*3/uL (ref 3.8–10.8)

## 2022-04-10 LAB — COMPREHENSIVE METABOLIC PANEL
AG Ratio: 1.3 (calc) (ref 1.0–2.5)
ALT: 44 U/L — ABNORMAL HIGH (ref 6–29)
AST: 32 U/L (ref 10–35)
Albumin: 4.2 g/dL (ref 3.6–5.1)
Alkaline phosphatase (APISO): 84 U/L (ref 37–153)
BUN: 8 mg/dL (ref 7–25)
CO2: 25 mmol/L (ref 20–32)
Calcium: 9.4 mg/dL (ref 8.6–10.4)
Chloride: 102 mmol/L (ref 98–110)
Creat: 0.65 mg/dL (ref 0.50–1.05)
Globulin: 3.2 g/dL (calc) (ref 1.9–3.7)
Glucose, Bld: 191 mg/dL — ABNORMAL HIGH (ref 65–99)
Potassium: 4.1 mmol/L (ref 3.5–5.3)
Sodium: 139 mmol/L (ref 135–146)
Total Bilirubin: 0.7 mg/dL (ref 0.2–1.2)
Total Protein: 7.4 g/dL (ref 6.1–8.1)

## 2022-04-10 LAB — HEMOGLOBIN A1C
Hgb A1c MFr Bld: 7.9 % of total Hgb — ABNORMAL HIGH (ref <5.7)
Mean Plasma Glucose: 180 mg/dL
eAG (mmol/L): 10 mmol/L

## 2022-04-12 ENCOUNTER — Other Ambulatory Visit: Payer: Self-pay | Admitting: Family

## 2022-04-12 MED ORDER — ROSUVASTATIN CALCIUM 10 MG PO TABS: 10.0000 mg | ORAL_TABLET | Freq: Every day | ORAL | 3 refills | Status: DC

## 2022-04-16 ENCOUNTER — Other Ambulatory Visit: Payer: Self-pay

## 2022-04-16 ENCOUNTER — Encounter (HOSPITAL_COMMUNITY): Payer: Self-pay

## 2022-04-16 ENCOUNTER — Emergency Department (HOSPITAL_COMMUNITY)
Admission: EM | Admit: 2022-04-16 | Discharge: 2022-04-16 | Disposition: A | Discharge status: Home / Self Care | Payer: No Typology Code available for payment source | Attending: Emergency Medicine | Admitting: Emergency Medicine

## 2022-04-16 DIAGNOSIS — M79602 Pain in left arm: Secondary | ICD-10-CM | POA: Diagnosis not present

## 2022-04-16 DIAGNOSIS — Z79899 Other long term (current) drug therapy: Secondary | ICD-10-CM | POA: Diagnosis not present

## 2022-04-16 DIAGNOSIS — R030 Elevated blood-pressure reading, without diagnosis of hypertension: Secondary | ICD-10-CM | POA: Diagnosis present

## 2022-04-16 DIAGNOSIS — I10 Essential (primary) hypertension: Secondary | ICD-10-CM | POA: Diagnosis not present

## 2022-04-16 LAB — CBC
HCT: 40.3 % (ref 36.0–46.0)
Hemoglobin: 13 g/dL (ref 12.0–15.0)
MCH: 28 pg (ref 26.0–34.0)
MCHC: 32.3 g/dL (ref 30.0–36.0)
MCV: 86.9 fL (ref 80.0–100.0)
Platelets: 303 10*3/uL (ref 150–400)
RBC: 4.64 MIL/uL (ref 3.87–5.11)
RDW: 13.1 % (ref 11.5–15.5)
WBC: 8.5 10*3/uL (ref 4.0–10.5)
nRBC: 0 % (ref 0.0–0.2)

## 2022-04-16 LAB — BASIC METABOLIC PANEL
Anion gap: 7 (ref 5–15)
BUN: 9 mg/dL (ref 8–23)
CO2: 25 mmol/L (ref 22–32)
Calcium: 8.7 mg/dL — ABNORMAL LOW (ref 8.9–10.3)
Chloride: 105 mmol/L (ref 98–111)
Creatinine, Ser: 0.66 mg/dL (ref 0.44–1.00)
GFR, Estimated: >60 mL/min (ref >60)
Glucose, Bld: 173 mg/dL — ABNORMAL HIGH (ref 70–99)
Potassium: 3.5 mmol/L (ref 3.5–5.1)
Sodium: 137 mmol/L (ref 135–145)

## 2022-04-16 LAB — URINALYSIS, ROUTINE W REFLEX MICROSCOPIC
Bilirubin Urine: NEGATIVE
Glucose, UA: NEGATIVE mg/dL
Hgb urine dipstick: NEGATIVE
Ketones, ur: NEGATIVE mg/dL
Leukocytes,Ua: NEGATIVE
Nitrite: NEGATIVE
Protein, ur: NEGATIVE mg/dL
Specific Gravity, Urine: 1.023 (ref 1.005–1.030)
pH: 5 (ref 5.0–8.0)

## 2022-04-16 LAB — TROPONIN I (HIGH SENSITIVITY): Troponin I (High Sensitivity): 5 ng/L (ref <18)

## 2022-04-16 LAB — CBG MONITORING, ED: Glucose-Capillary: 164 mg/dL — ABNORMAL HIGH (ref 70–99)

## 2022-04-16 MED ORDER — CLONIDINE HCL 0.1 MG PO TABS
0.2000 mg | ORAL_TABLET | Freq: Once | ORAL | Status: AC
  Administered 2022-04-16: 0.2 mg via ORAL
  Filled 2022-04-16: qty 2

## 2022-04-16 MED ORDER — AMLODIPINE BESYLATE 5 MG PO TABS: 5.0000 mg | ORAL_TABLET | Freq: Every day | ORAL | 0 refills | Status: DC

## 2022-04-16 NOTE — ED Triage Notes (Signed)
Pt endorses new hypertension and L sided numbness xseveral days. Pt states that she hasn't been taking her diabetes meds. No other focal neuro sx.

## 2022-04-16 NOTE — ED Provider Notes (Signed)
[facility] EMERGENCY DEPARTMENT AT [facility] Provider Note   CSN: [id number] Arrival date & time: 04/16/22  1810     History  CC: HTN  Lindsay Carroll is a 67 y.o. female presenting emergency department with elevated blood pressure.  Patient reports that she noted her blood pressure was high when she went to see her doctor last week in the office.  She does not regularly check her blood pressure.  She says her doctor did not start her on any blood pressure medicine.  She subsequently went to [facility] today and checked her pressure again and it was high, 0000000 or 70 systolic.  She does not know what her baseline blood pressure is.  She denies to me that she is having any headache, numbness or weakness, or tingling.  Please note that I clarified this issue of reported left-sided numbness from triage, the patient reports that has not numbness but she was having pain intermittently in her left arm for the past 3 weeks.  The pain is worse with abduction of her arm to the side.  She denies any chest pain, lightheadedness, diaphoresis, or cardiac issues.  HPI     Home Medications Prior to Admission medications   Medication Sig Start Date End Date Taking? Authorizing Provider  amLODipine (NORVASC) 5 MG tablet Take 1 tablet (5 mg total) by mouth daily for 30 doses. 04/16/22 05/16/22 Yes Abbygale Lapid, Carola Rhine, MD  EPINEPHrine 0.3 mg/0.3 mL IJ SOAJ injection Inject 0.3 mg into the muscle as needed for anaphylaxis. 11/12/21   Marrian Salvage, FNP  meloxicam (MOBIC) 15 MG tablet Take 1 tablet (15 mg total) by mouth daily. 04/09/22   Marrian Salvage, FNP  metFORMIN (GLUCOPHAGE) 500 MG tablet Take 1 tablet (500 mg total) by mouth 2 (two) times daily with a meal. 03/24/22   Marrian Salvage, FNP  rosuvastatin (CRESTOR) 10 MG tablet Take 1 tablet (10 mg total) by mouth daily. 04/12/22   Marrian Salvage, FNP      Allergies    Apple, Apple juice, and Pectin    Review of Systems    Review of Systems  Physical Exam Updated Vital Signs BP (!) 156/86   Pulse 70   Temp 98.2 F (36.8 C) (Oral)   Resp 15   Ht '5\' 4"'$  (1.626 m)   Wt 95.3 kg   LMP  (LMP Unknown)   SpO2 96%   BMI 36.05 kg/m  Physical Exam Constitutional:      General: She is not in acute distress. HENT:     Head: Normocephalic and atraumatic.  Eyes:     Conjunctiva/sclera: Conjunctivae normal.     Pupils: Pupils are equal, round, and reactive to light.  Cardiovascular:     Rate and Rhythm: Normal rate and regular rhythm.  Pulmonary:     Effort: Pulmonary effort is normal. No respiratory distress.  Abdominal:     General: There is no distension.     Tenderness: There is no abdominal tenderness.  Musculoskeletal:     Comments: Left arm pain is reproducible with abduction of the left arm, also trigger point tenderness of the left deltoid muscle  Skin:    General: Skin is warm and dry.  Neurological:     General: No focal deficit present.     Mental Status: She is alert and oriented to person, place, and time. Mental status is at baseline.     Cranial Nerves: No cranial nerve deficit.  Sensory: No sensory deficit.     Motor: No weakness.  Psychiatric:        Mood and Affect: Mood normal.        Behavior: Behavior normal.     ED Results / Procedures / Treatments   Labs (all labs ordered are listed, but only abnormal results are displayed) Labs Reviewed  BASIC METABOLIC PANEL - Abnormal; Notable for the following components:      Result Value   Glucose, Bld 173 (*)    Calcium 8.7 (*)    All other components within normal limits  CBG MONITORING, ED - Abnormal; Notable for the following components:   Glucose-Capillary 164 (*)    All other components within normal limits  CBC  URINALYSIS, ROUTINE W REFLEX MICROSCOPIC  TROPONIN I (HIGH SENSITIVITY)    EKG EKG Interpretation  Date/Time:  Friday April 16 2022 18:18:18 EST Ventricular Rate:  80 PR Interval:  140 QRS  Duration: 83 QT Interval:  384 QTC Calculation: 443 R Axis:   -14 Text Interpretation: Sinus rhythm Low voltage, precordial leads Consider anterior infarct Confirmed by Octaviano Glow [phone]) on 04/16/2022 6:28:46 PM  Radiology No results found.  Procedures Procedures    Medications Ordered in ED Medications  cloNIDine (CATAPRES) tablet 0.2 mg (0.2 mg Oral Given 04/16/22 1846)    ED Course/ Medical Decision Making/ A&P                             Medical Decision Making Amount and/or Complexity of Data Reviewed Labs: ordered.  Risk Prescription drug management.   Patient is here with elevated blood pressure and left arm pain.  She does not demonstrate evidence of confusion, PRES, stroke at this time.  She does not have left-sided numbness when I clarified with her on exam.  I do not see indication for emergent neuroimaging at this time.  Clonidine was given for blood pressure management in the ED, but she will need to be initiated on hypertension medicine, can start on amlodipine at home until she follows up again with her PCP.  Is not clear to me why her doctor's office did not start her on blood pressure medicine when her pressure was high, perhaps her monitoring to see if this continues to be high.  I personally reviewed and interpreted the patient's workup, including EKG.  There is no acute ischemic finding on the EKG.  No evidence of MI or renal failure on her basic labs.  I strongly suspect that her left arm pain is musculoskeletal in nature it is directly reproducible with movement of her left arm.  I have a lower suspicion for ACS or PE at this time.          Final Clinical Impression(s) / ED Diagnoses Final diagnoses:  Hypertension, unspecified type  Left arm pain    Rx / DC Orders ED Discharge Orders          Ordered    amLODipine (NORVASC) 5 MG tablet  Daily        04/16/22 2101              Wyvonnia Dusky, MD 04/16/22 2323

## 2022-04-20 ENCOUNTER — Telehealth: Payer: Self-pay | Admitting: Family

## 2022-04-20 ENCOUNTER — Other Ambulatory Visit: Payer: Self-pay | Admitting: Family

## 2022-04-20 DIAGNOSIS — M25532 Pain in left wrist: Secondary | ICD-10-CM

## 2022-04-20 NOTE — Telephone Encounter (Signed)
Pt said that the meloxicam is not helping her pain at all. She said [facility] does not accept her insurance but that [facility] [address] in [place] does but they need a referral. Pt is requesting a referral be sent to them instead.

## 2022-04-21 ENCOUNTER — Ambulatory Visit (INDEPENDENT_AMBULATORY_CARE_PROVIDER_SITE_OTHER): Payer: PRIVATE HEALTH INSURANCE

## 2022-04-21 ENCOUNTER — Encounter: Payer: Self-pay | Admitting: Orthopedic Surgery

## 2022-04-21 ENCOUNTER — Ambulatory Visit (INDEPENDENT_AMBULATORY_CARE_PROVIDER_SITE_OTHER): Payer: PRIVATE HEALTH INSURANCE | Admitting: Orthopedic Surgery

## 2022-04-21 DIAGNOSIS — M79602 Pain in left arm: Secondary | ICD-10-CM

## 2022-04-21 NOTE — Telephone Encounter (Signed)
Spoke with pt, pt has appt with orthopedic 04/21/2022. Pt states she would like to start PT as soon as possible as she has been dealing with the pain since 03/24/2022.

## 2022-04-23 ENCOUNTER — Encounter: Payer: Self-pay | Admitting: Family

## 2022-04-23 ENCOUNTER — Ambulatory Visit: Payer: PRIVATE HEALTH INSURANCE | Admitting: Family

## 2022-04-23 VITALS — BP 138/72 | HR 82 | Resp 18 | Ht 64.0 in | Wt 209.0 lb

## 2022-04-23 DIAGNOSIS — R030 Elevated blood-pressure reading, without diagnosis of hypertension: Secondary | ICD-10-CM | POA: Diagnosis not present

## 2022-04-23 MED ORDER — AMLODIPINE BESYLATE 5 MG PO TABS: ORAL_TABLET | ORAL | 1 refills | Status: DC

## 2022-04-23 NOTE — Progress Notes (Signed)
Lindsay Carroll is a 67 y.o. female with the following history as recorded in EpicCare:  Patient Active Problem List   Diagnosis Date Noted   Postoperative state 07/22/2020   Pelvic pain 07/22/2020   Post-menopausal bleeding 07/22/2020   Fibroids, intramural 07/22/2020   Laceration of vulva 07/22/2020   Post-operative state 07/22/2020   Vaginal bleeding 04/21/2020    Current Outpatient Medications  Medication Sig Dispense Refill   EPINEPHrine 0.3 mg/0.3 mL IJ SOAJ injection Inject 0.3 mg into the muscle as needed for anaphylaxis. 1 each 1   meloxicam (MOBIC) 15 MG tablet Take 1 tablet (15 mg total) by mouth daily. 30 tablet 0   metFORMIN (GLUCOPHAGE) 500 MG tablet Take 1 tablet (500 mg total) by mouth 2 (two) times daily with a meal. 60 tablet 0   rosuvastatin (CRESTOR) 10 MG tablet Take 1 tablet (10 mg total) by mouth daily. 90 tablet 3   amLODipine (NORVASC) 5 MG tablet 1 tablet po qd 90 tablet 1   No current facility-administered medications for this visit.    Allergies: Apple, Apple juice, and Pectin  Past Medical History:  Diagnosis Date   Anemia due to blood loss, chronic    hx anemia but had hospital admission 04-21-2020 for heavy vaginal bleeding , abd. pain, fatigue, Hg 4.5;  pt transfused 3 units PRBCs, pt discharged 04-23-2020 Hg 8.9;  pt is scheduled for hysteroscopy with myosure 04-30-2020 by Dr Lanny Cramp (gyn)   PMB (postmenopausal bleeding)    chronic   Submucous uterine fibroid    Wears partial dentures    upper only    Past Surgical History:  Procedure Laterality Date   DILATATION & CURETTAGE/HYSTEROSCOPY WITH MYOSURE N/A 04/30/2020   Procedure: [place];  Surgeon: Armandina Stammer, DO;  Location: [facility];  Service: Gynecology;  Laterality: N/A;   NO PAST SURGERIES     ROBOTIC ASSISTED TOTAL HYSTERECTOMY WITH BILATERAL SALPINGO OOPHERECTOMY N/A 07/22/2020   Procedure: XI ROBOTIC ASSISTED TOTAL HYSTERECTOMY  WITH BILATERAL SALPINGO OOPHORECTOMY, REPAIR OF PERI URETHRAL LACERATION;  Surgeon: Lavonia Drafts, MD;  Location: [facility];  Service: Gynecology;  Laterality: N/A;    Family History  Problem Relation Age of Onset   Cancer Mother 20       colon   Cancer Father 42       colon    Social History   Tobacco Use   Smoking status: Never   Smokeless tobacco: Never  Substance Use Topics   Alcohol use: Not Currently    Comment: 04-28-2020  per pt last quit alcohol 1998    Subjective:   1 week follow up on elevated blood pressure/ start of Amlodipine 5 mg daily; doing well on medication- no concerns for side effects;  Did see orthopedist regarding chronic left arm pain- will be starting PT;   Objective:  Vitals:   04/23/22 1504 04/23/22 1532  BP: (!) 144/78 138/72  Pulse: 82   Resp: 18   SpO2: 98%   Weight: 209 lb (94.8 kg)   Height: '5\' 4"'$  (1.626 m)     General: Well developed, well nourished, in no acute distress  Skin : Warm and dry.  Head: Normocephalic and atraumatic  Lungs: Respirations unlabored; clear to auscultation bilaterally without wheeze, rales, rhonchi  CVS exam: normal rate and regular rhythm.  Neurologic: Alert and oriented; speech intact; face symmetrical; moves all extremities well; CNII-XII intact without focal deficit   Assessment:  1. Elevated  blood pressure reading     Plan:  Responding well to Amlodipine 5 mg; continue as prescribed; follow up in 3 months;   No follow-ups on file.  No orders of the defined types were placed in this encounter.   Requested Prescriptions   Signed Prescriptions Disp Refills   amLODipine (NORVASC) 5 MG tablet 90 tablet 1    Sig: 1 tablet po qd

## 2022-04-24 ENCOUNTER — Encounter: Payer: Self-pay | Admitting: Orthopedic Surgery

## 2022-04-24 NOTE — Progress Notes (Signed)
Office Visit Note   Patient: Lindsay Carroll           Date of Birth: 07/07/1955           MRN: [id number] Visit Date: 04/21/2022 Requested by: Marrian Salvage, [address] [place],   [id number] PCP: Marrian Salvage, FNP  Subjective: Chief Complaint  Patient presents with   Left Shoulder - Pain   Neck - Pain    HPI: Lindsay Carroll is a 67 y.o. female who presents to the office reporting left arm pain since February 7.  Describes some numbness at times.  Does not report any injury.  Not having too much in terms of neck pain.  Had similar symptoms 6 years ago and she tried physical therapy and it helped.  Reports pain from the superior aspect in her back which radiates down the arm to the wrist.  Mobic has been tried without too much help.  Oxycodone one half of a tab makes her groggy.  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  She states that she has had some type of orthopedic workup which is determined that it is not coming from her neck.  The details of this workup are unavailable..                ROS: All systems reviewed are negative as they relate to the chief complaint within the history of present illness.  Patient denies fevers or chills.  Assessment & Plan: Visit Diagnoses:  1. Pain of left upper extremity     Plan: Impression is left arm and shoulder pain.  Radiographs unremarkable on the shoulder.  Plan is to try some physical therapy which could also include some cervical traction.  They can work on the shoulder and arm as well.  If that does not help then the neck step to be considered would be diagnostic and therapeutic injections in the shoulder as well as further workup consisting of MRI scanning on the neck.  Follow-up in 8 weeks for clinical recheck if not improved.  Follow-Up Instructions: Return in about 4 weeks (around 05/19/2022).   Orders:  Orders Placed This Encounter  Procedures   XR Cervical Spine 2  or 3 views   XR Shoulder Left   No orders of the defined types were placed in this encounter.     Procedures: No procedures performed   Clinical Data: No additional findings.  Objective: Vital Signs: LMP  (LMP Unknown)   Physical Exam:  Constitutional: Patient appears well-developed HEENT:  Head: Normocephalic Eyes:EOM are normal Neck: Normal range of motion Cardiovascular: Normal rate Pulmonary/chest: Effort normal Neurologic: Patient is alert Skin: Skin is warm Psychiatric: Patient has normal mood and affect  Ortho Exam: Ortho exam demonstrates 5 out of 5 grip EPL FPL interosseous wrist/extensor muscle triceps and deltoid strength.  Radial pulse intact bilaterally.  Patient has forward flexion and abduction of both shoulders above 90 degrees.  Passive range of motion well-maintained at 55/95/165.  No definite paresthesias C5-T1.  Reflexes symmetric 0 to 1+ out of 4 bilateral biceps and triceps.  No muscle atrophy in either arm.  Mild pain with rotation of the head to the right and left.  Specialty Comments:  No specialty comments available.  Imaging: No results found.   PMFS History: Patient Active Problem List   Diagnosis Date Noted   Postoperative state 07/22/2020   Pelvic pain 07/22/2020   Post-menopausal bleeding 07/22/2020  Fibroids, intramural 07/22/2020   Laceration of vulva 07/22/2020   Post-operative state 07/22/2020   Vaginal bleeding 04/21/2020   Past Medical History:  Diagnosis Date   Anemia due to blood loss, chronic    hx anemia but had hospital admission 04-21-2020 for heavy vaginal bleeding , abd. pain, fatigue, Hg 4.5;  pt transfused 3 units PRBCs, pt discharged 04-23-2020 Hg 8.9;  pt is scheduled for hysteroscopy with myosure 04-30-2020 by Dr Lanny Cramp (gyn)   PMB (postmenopausal bleeding)    chronic   Submucous uterine fibroid    Wears partial dentures    upper only    Family History  Problem Relation Age of Onset   Cancer Mother 28        colon   Cancer Father 67       colon    Past Surgical History:  Procedure Laterality Date   DILATATION & CURETTAGE/HYSTEROSCOPY WITH MYOSURE N/A 04/30/2020   Procedure: DILATATION & CURETTAGE/HYSTEROSCOPY WITH MYOSURE;  Surgeon: Armandina Stammer, DO;  Location: [facility];  Service: Gynecology;  Laterality: N/A;   NO PAST SURGERIES     ROBOTIC ASSISTED TOTAL HYSTERECTOMY WITH BILATERAL SALPINGO OOPHERECTOMY N/A 07/22/2020   Procedure: XI ROBOTIC ASSISTED TOTAL HYSTERECTOMY WITH BILATERAL SALPINGO OOPHORECTOMY, REPAIR OF PERI URETHRAL LACERATION;  Surgeon: Lavonia Drafts, MD;  Location: [facility];  Service: Gynecology;  Laterality: N/A;   Social History   Occupational History   Not on file  Tobacco Use   Smoking status: Never   Smokeless tobacco: Never  Vaping Use   Vaping Use: Never used  Substance and Sexual Activity   Alcohol use: Not Currently    Comment: 04-28-2020  per pt last quit alcohol 1998   Drug use: Not Currently    Comment: 04-28-2020  per pt hx drug use, last used 1996   Sexual activity: Not Currently    Birth control/protection: Post-menopausal

## 2022-05-06 ENCOUNTER — Telehealth: Payer: Self-pay | Admitting: Orthopedic Surgery

## 2022-05-06 DIAGNOSIS — M79602 Pain in left arm: Secondary | ICD-10-CM

## 2022-05-06 NOTE — Telephone Encounter (Signed)
Patient needs a pre [facility] for P/T to [facility] for P/T. Please call patinet when complete [phone]

## 2022-05-07 NOTE — Addendum Note (Signed)
Addended by: Robyne Peers on: 05/07/2022 08:22 AM   Modules accepted: Orders

## 2022-05-08 ENCOUNTER — Other Ambulatory Visit: Payer: Self-pay | Admitting: Family

## 2022-05-11 ENCOUNTER — Telehealth: Payer: Self-pay | Admitting: Orthopedic Surgery

## 2022-05-11 NOTE — Telephone Encounter (Signed)
Pt called asking for a call back about her pre Auth to [facility] for physical Therapy. Please call pt tomorrow at [phone]. Pt is concern due to pre auth being sent to different facility

## 2022-05-18 ENCOUNTER — Encounter: Payer: Self-pay | Admitting: Family

## 2022-05-18 ENCOUNTER — Telehealth (INDEPENDENT_AMBULATORY_CARE_PROVIDER_SITE_OTHER): Payer: PRIVATE HEALTH INSURANCE | Admitting: Family

## 2022-05-18 VITALS — Ht 64.0 in | Wt 208.0 lb

## 2022-05-18 DIAGNOSIS — E119 Type 2 diabetes mellitus without complications: Secondary | ICD-10-CM | POA: Diagnosis not present

## 2022-05-18 DIAGNOSIS — I10 Essential (primary) hypertension: Secondary | ICD-10-CM

## 2022-05-18 MED ORDER — ROSUVASTATIN CALCIUM 10 MG PO TABS: 10.0000 mg | ORAL_TABLET | Freq: Every day | ORAL | 3 refills | Status: AC

## 2022-05-18 MED ORDER — METFORMIN HCL 500 MG PO TABS: 500.0000 mg | ORAL_TABLET | Freq: Every day | ORAL | 1 refills | Status: AC

## 2022-05-18 MED ORDER — AMLODIPINE BESYLATE 5 MG PO TABS: ORAL_TABLET | ORAL | 1 refills | Status: AC

## 2022-05-18 NOTE — Progress Notes (Addendum)
Lindsay Carroll is a 67 y.o. female with the following history as recorded in EpicCare:  Patient Active Problem List   Diagnosis Date Noted   Postoperative state 07/22/2020   Pelvic pain 07/22/2020   Post-menopausal bleeding 07/22/2020   Fibroids, intramural 07/22/2020   Laceration of vulva 07/22/2020   Post-operative state 07/22/2020   Vaginal bleeding 04/21/2020    Current Outpatient Medications  Medication Sig Dispense Refill   EPINEPHrine 0.3 mg/0.3 mL IJ SOAJ injection Inject 0.3 mg into the muscle as needed for anaphylaxis. 1 each 1   meloxicam (MOBIC) 15 MG tablet TAKE 1 TABLET(15 MG) BY MOUTH DAILY 30 tablet 0   amLODipine (NORVASC) 5 MG tablet 1 tablet po qd 90 tablet 1   metFORMIN (GLUCOPHAGE) 500 MG tablet Take 1 tablet (500 mg total) by mouth daily with breakfast. 90 tablet 1   rosuvastatin (CRESTOR) 10 MG tablet Take 1 tablet (10 mg total) by mouth daily. 90 tablet 3   No current facility-administered medications for this visit.    Allergies: Apple, Apple juice, and Pectin  Past Medical History:  Diagnosis Date   Anemia due to blood loss, chronic    hx anemia but had hospital admission 04-21-2020 for heavy vaginal bleeding , abd. pain, fatigue, Hg 4.5;  pt transfused 3 units PRBCs, pt discharged 04-23-2020 Hg 8.9;  pt is scheduled for hysteroscopy with myosure 04-30-2020 by Dr Lanny Cramp (gyn)   PMB (postmenopausal bleeding)    chronic   Submucous uterine fibroid    Wears partial dentures    upper only    Past Surgical History:  Procedure Laterality Date   DILATATION & CURETTAGE/HYSTEROSCOPY WITH MYOSURE N/A 04/30/2020   Procedure: [place];  Surgeon: Armandina Stammer, DO;  Location: [facility];  Service: Gynecology;  Laterality: N/A;   NO PAST SURGERIES     ROBOTIC ASSISTED TOTAL HYSTERECTOMY WITH BILATERAL SALPINGO OOPHERECTOMY N/A 07/22/2020   Procedure: XI ROBOTIC ASSISTED TOTAL HYSTERECTOMY WITH BILATERAL  SALPINGO OOPHORECTOMY, REPAIR OF PERI URETHRAL LACERATION;  Surgeon: Lavonia Drafts, MD;  Location: [facility];  Service: Gynecology;  Laterality: N/A;    Family History  Problem Relation Age of Onset   Cancer Mother 55       colon   Cancer Father 35       colon    Social History   Tobacco Use   Smoking status: Never   Smokeless tobacco: Never  Substance Use Topics   Alcohol use: Not Currently    Comment: 04-28-2020  per pt last quit alcohol 1998    Subjective:    I connected with Ardeth Sportsman on 05/18/22 at  1:20 PM EDT by a telephone call and verified that I am speaking with the correct person using two identifiers.   I discussed the limitations of evaluation and management by telemedicine and the availability of in person appointments. The patient expressed understanding and agreed to proceed. Provider in office/ patient is at home; provider and patient are only 2 people on telephone call.   Patient requesting updated refills for her chronic care needs- is currently taking Amlodipine, Metformin daily and Rosuvastatin;   Also requesting updated letter for her employer regarding suspected allergies to dogs/ cats that would limit her ability to do in home visits;  Will be seeing her orthopedist tomorrow in follow up;     Objective:  Vitals:   05/18/22 1305  Weight: 208 lb (94.3 kg)  Height: 5\' 4"  (1.626  m)    Lungs: Respirations unlabored;  Neurologic: Alert and oriented; speech intact;   Assessment:  1. Type 2 diabetes mellitus without complication, without long-term current use of insulin   2. Primary hypertension     Plan:  Refills updated as requested; she will plan to keep follow up in 3 months; Letter written as requested for work accommodations;   Time spent 12 minutes  No follow-ups on file.  No orders of the defined types were placed in this encounter.   Requested Prescriptions   Signed Prescriptions Disp Refills    amLODipine (NORVASC) 5 MG tablet 90 tablet 1    Sig: 1 tablet po qd   metFORMIN (GLUCOPHAGE) 500 MG tablet 90 tablet 1    Sig: Take 1 tablet (500 mg total) by mouth daily with breakfast.   rosuvastatin (CRESTOR) 10 MG tablet 90 tablet 3    Sig: Take 1 tablet (10 mg total) by mouth daily.

## 2022-05-19 ENCOUNTER — Ambulatory Visit (INDEPENDENT_AMBULATORY_CARE_PROVIDER_SITE_OTHER): Payer: PRIVATE HEALTH INSURANCE | Admitting: Orthopedic Surgery

## 2022-05-19 DIAGNOSIS — M79602 Pain in left arm: Secondary | ICD-10-CM | POA: Diagnosis not present

## 2022-05-22 ENCOUNTER — Encounter: Payer: Self-pay | Admitting: Orthopedic Surgery

## 2022-05-22 NOTE — Progress Notes (Signed)
Office Visit Note   Patient: Lindsay Carroll           Date of Birth: Jul 08, 1955           MRN: [id number] Visit Date: 05/19/2022 Requested by: Olive Bass, FNP [address] [place],  [place] [address] PCP: Olive Bass, FNP  Subjective: Chief Complaint  Patient presents with   Left Shoulder - Follow-up   Neck - Follow-up    HPI: Lindsay Carroll is a 67 y.o. female who presents to the office reporting left shoulder arm and neck pain.  Pain is not as severe.  Patient is doing about the same overall.  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  Would like a new referral to physical therapy upstairs.  Does describe some numbness and tingling in the fingers with some radiating pain.  Denies any neck pain.  Has some pain in the shoulder and forearm today.  She does take Mobic..                ROS: All systems reviewed are negative as they relate to the chief complaint within the history of present illness.  Patient denies fevers or chills.  Assessment & Plan: Visit Diagnoses:  1. Pain of left upper extremity     Plan: Impression is shoulder and neck pain on the left-hand side.  Plan is to start physical therapy here 2 times a week for 6 weeks with modalities and home exercise program.  Follow-up as needed.  Follow-Up Instructions: No follow-ups on file.   Orders:  Orders Placed This Encounter  Procedures   Ambulatory referral to Physical Therapy   No orders of the defined types were placed in this encounter.     Procedures: No procedures performed   Clinical Data: No additional findings.  Objective: Vital Signs: LMP  (LMP Unknown)   Physical Exam:  Constitutional: Patient appears well-developed HEENT:  Head: Normocephalic Eyes:EOM are normal Neck: Normal range of motion Cardiovascular: Normal rate Pulmonary/chest: Effort normal Neurologic: Patient is alert Skin: Skin is warm Psychiatric: Patient has normal mood and  affect  Ortho Exam: Ortho examination essentially unchanged from prior visit.  Has reasonable shoulder range of motion and strength with no paresthesias in the arm.  Negative Tinel's cubital tunnel in the elbow with palpable radial pulse on that left-hand side.  Neck range of motion is full with some stiffness in the trapezial region.  Specialty Comments:  No specialty comments available.  Imaging: No results found.   PMFS History: Patient Active Problem List   Diagnosis Date Noted   Postoperative state 07/22/2020   Pelvic pain 07/22/2020   Post-menopausal bleeding 07/22/2020   Fibroids, intramural 07/22/2020   Laceration of vulva 07/22/2020   Post-operative state 07/22/2020   Vaginal bleeding 04/21/2020   Past Medical History:  Diagnosis Date   Anemia due to blood loss, chronic    hx anemia but had hospital admission 04-21-2020 for heavy vaginal bleeding , abd. pain, fatigue, Hg 4.5;  pt transfused 3 units PRBCs, pt discharged 04-23-2020 Hg 8.9;  pt is scheduled for hysteroscopy with myosure 04-30-2020 by Dr Conni Elliot (gyn)   PMB (postmenopausal bleeding)    chronic   Submucous uterine fibroid    Wears partial dentures    upper only    Family History  Problem Relation Age of Onset   Cancer Mother 64       colon   Cancer Father 61  colon    Past Surgical History:  Procedure Laterality Date   DILATATION & CURETTAGE/HYSTEROSCOPY WITH MYOSURE N/A 04/30/2020   Procedure: DILATATION & CURETTAGE/HYSTEROSCOPY WITH MYOSURE;  Surgeon: Toy Baker, DO;  Location: [facility];  Service: Gynecology;  Laterality: N/A;   NO PAST SURGERIES     ROBOTIC ASSISTED TOTAL HYSTERECTOMY WITH BILATERAL SALPINGO OOPHERECTOMY N/A 07/22/2020   Procedure: XI ROBOTIC ASSISTED TOTAL HYSTERECTOMY WITH BILATERAL SALPINGO OOPHORECTOMY, REPAIR OF PERI URETHRAL LACERATION;  Surgeon: Willodean Rosenthal, MD;  Location: [facility];  Service: Gynecology;  Laterality: N/A;    Social History   Occupational History   Not on file  Tobacco Use   Smoking status: Never   Smokeless tobacco: Never  Vaping Use   Vaping Use: Never used  Substance and Sexual Activity   Alcohol use: Not Currently    Comment: 04-28-2020  per pt last quit alcohol 1998   Drug use: Not Currently    Comment: 04-28-2020  per pt hx drug use, last used 1996   Sexual activity: Not Currently    Birth control/protection: Post-menopausal

## 2022-05-26 NOTE — Therapy (Signed)
OUTPATIENT PHYSICAL THERAPY EVALUATION   Patient Name: Lindsay Carroll MRN: [id number] DOB:[birth year], 67 y.o., female Today's Date: 05/27/2022  END OF SESSION:  PT End of Session - 05/27/22 1114     Visit Number 1    Number of Visits 20    Date for PT Re-Evaluation 08/05/22    Authorization Type [employer] $50 copay    Progress Note Due on Visit 10    PT Start Time 1116    PT Stop Time 1155    PT Time Calculation (min) 39 min    Activity Tolerance Patient tolerated treatment well    Behavior During Therapy WFL for tasks assessed/performed             Past Medical History:  Diagnosis Date   Anemia due to blood loss, chronic    hx anemia but had hospital admission 04-21-2020 for heavy vaginal bleeding , abd. pain, fatigue, Hg 4.5;  pt transfused 3 units PRBCs, pt discharged 04-23-2020 Hg 8.9;  pt is scheduled for hysteroscopy with myosure 04-30-2020 by Dr Conni Elliot (gyn)   PMB (postmenopausal bleeding)    chronic   Submucous uterine fibroid    Wears partial dentures    upper only   Past Surgical History:  Procedure Laterality Date   DILATATION & CURETTAGE/HYSTEROSCOPY WITH MYOSURE N/A 04/30/2020   Procedure: DILATATION & CURETTAGE/HYSTEROSCOPY WITH MYOSURE;  Surgeon: Toy Baker, DO;  Location: [facility];  Service: Gynecology;  Laterality: N/A;   NO PAST SURGERIES     ROBOTIC ASSISTED TOTAL HYSTERECTOMY WITH BILATERAL SALPINGO OOPHERECTOMY N/A 07/22/2020   Procedure: XI ROBOTIC ASSISTED TOTAL HYSTERECTOMY WITH BILATERAL SALPINGO OOPHORECTOMY, REPAIR OF PERI URETHRAL LACERATION;  Surgeon: Willodean Rosenthal, MD;  Location: [facility];  Service: Gynecology;  Laterality: N/A;   Patient Active Problem List   Diagnosis Date Noted   Postoperative state 07/22/2020   Pelvic pain 07/22/2020   Post-menopausal bleeding 07/22/2020   Fibroids, intramural 07/22/2020   Laceration of vulva 07/22/2020   Post-operative state 07/22/2020   Vaginal  bleeding 04/21/2020    PCP: Olive Bass, FNP  REFERRING PROVIDER: Cammy Copa, MD  REFERRING DIAG: [phone] (ICD-10-CM) - Pain of left upper extremity  THERAPY DIAG:  Cervicalgia  Pain in left arm  Muscle weakness (generalized)  Abnormal posture  Rationale for Evaluation and Treatment: Rehabilitation  ONSET DATE: 03/24/2022 per MD note  SUBJECTIVE:  SUBJECTIVE STATEMENT: Chief complaint of neck and Lt shoulder/arm symptoms.  She reported Feb onset down arm to wrist.  She reported some reduction of symptoms in last two weeks compared to several pain initially.  She reported waking at night due to pain.  Upon arrival reported pain in distal posterior arm to wrist.   PERTINENT HISTORY:  Has had physical therapy at different clinic than [facility] 2x (about a month ago)   Had symptoms 6 years ago with resolution with physical therapy care.  She reported some arm movements helped.  Had traction in that time.   PAIN:  NPRS scale: current 3/10, at worst in last 2-3 weeks 3/10 Pain location: cervical, Lt arm  Pain description: sharp in past, tingling at times Aggravating factors: no specific activities reported, worse at night Relieving factors: nothing specific  PRECAUTIONS: None  WEIGHT BEARING RESTRICTIONS: No  FALLS:  Has patient fallen in last 6 months? 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   PATIENT GOALS: Reduce pain, get better.    OBJECTIVE:  IMAGING: 05/27/2022 Chart review: Neck on 04/24/2022: AP lateral radiographs cervical spine reviewed.  Lordosis  maintained.   Patient does have mild to moderate degenerative disc disease at C5-6 and  the cervical spine.  Less severe facet arthritis is present at this level  and below.  No spondylolisthesis or compression fractures.   Shoulder Lt on 04/24/2022: AP lateral radiographs left shoulder reviewed.  No acute fracture or  dislocation.  No glenohumeral joint arthritis and mild AC joint arthritis  present.  Visualized lung fields clear.  Acromiohumeral distance  maintained.   PATIENT SURVEYS:  05/27/2022 FOTO intake: 48   predicted:  60  COGNITION: 05/27/2022 Overall cognitive status: Within functional limits for tasks assessed  SENSATION: 05/27/2022 Mild hypersensitivity in C5, C7 on Lt compared to Rt to light touch.    POSTURE: 05/27/2022 rounded shoulders and forward head  PALPATION: 05/27/2022 Trigger points c possible concordant arm symptoms noted in Lt infraspinatus   CERVICAL ROM:  05/27/2022: no specific impact on symptoms in Lt arm during cervical mobility check  ROM AROM (deg) 05/27/2022  Flexion 60  Extension 50  Right lateral flexion   Left lateral flexion   Right rotation 75  Left rotation 65   (Blank rows = not tested)  UPPER EXTREMITY ROM:  ROM Right 05/27/2022 Left 05/27/2022  Shoulder flexion [facility]  Shoulder extension    Shoulder abduction    Shoulder adduction    Shoulder extension    Shoulder internal rotation    Shoulder external rotation    Elbow flexion    Elbow extension    Wrist flexion    Wrist extension    Wrist ulnar deviation    Wrist radial deviation    Wrist pronation    Wrist supination     (Blank rows = not tested)  UPPER EXTREMITY MMT:  MMT Right 05/27/2022 Left 05/27/2022  Shoulder flexion 5/5 5/5 c mild discomfort  Shoulder extension    Shoulder abduction 5/5 4+/5 c mild discomfort  Shoulder adduction    Shoulder extension    Shoulder internal rotation 5/5 5/5  Shoulder external rotation 5/5 5/5  Middle trapezius     Lower trapezius    Elbow flexion 5/5 5/5  Elbow extension 5/5 5/5  Wrist flexion  Wrist extension    Wrist ulnar deviation    Wrist radial deviation    Wrist pronation    Wrist supination    Grip strength     (Blank rows = not tested)  CERVICAL SPECIAL TESTS:  05/27/2022 Spurling's Compression: (-) bilaterally Cervical distraction supine: (+) reduction of Lt shoulder discomfort in performance  FUNCTIONAL TESTS:  05/27/2022 No specific testing   TODAY'S TREATMENT:                                                                    DATE: 05/27/2022 Therex:    HEP instruction/performance c cues for techniques, handout provided.  Trial set performed of each for comprehension and symptom assessment.  See below for exercise list  Manual: Supine intermittent cervical distraction.   PATIENT EDUCATION:  05/27/2022 Education details: HEP, POC Person educated: Patient Education method: Programmer, multimedia, Demonstration, Verbal cues, and Handouts Education comprehension: verbalized understanding, returned demonstration, and verbal cues required  HOME EXERCISE PROGRAM: Access Code: [email or web address] URL: [email or web address] Date: 05/27/2022 Prepared by: Chyrel Masson  Exercises - Supine Deep Neck Flexor Training  - 1-2 x daily - 7 x weekly - 1 sets - 10 reps - 5 hold - Supine Scapular Retraction  - 1-2 x daily - 7 x weekly - 1 sets - 10 reps - 5 hold - Shoulder External Rotation and Scapular Retraction  - 3-5 x daily - 7 x weekly - 1 sets - 5 reps - 2 hold - Seated Cervical Retraction  - 1-2 x daily - 7 x weekly - 1 sets - 10 reps - 5 hold - Seated Posterior Shoulder Stretch  - 1-2 x daily - 7 x weekly - 1 sets - 3-5 reps - 15 hold  Patient Education - Trigger Point Dry Needling  ASSESSMENT:  CLINICAL IMPRESSION: Patient is a 67 y.o. who comes to clinic with complaints of neck, Lt arm pain with mobility, strength deficits that impair their ability to perform usual daily and  recreational functional activities without increase difficulty/symptoms at this time.  Patient to benefit from skilled PT services to address impairments and limitations to improve to previous level of function without restriction secondary to condition.    OBJECTIVE IMPAIRMENTS: decreased activity tolerance, decreased endurance, decreased mobility, decreased ROM, decreased strength, impaired perceived functional ability, increased muscle spasms, impaired flexibility, impaired UE functional use, improper body mechanics, postural dysfunction, and pain.   ACTIVITY LIMITATIONS: carrying, lifting, sitting, and sleeping  PARTICIPATION LIMITATIONS: meal prep, cleaning, laundry, interpersonal relationship, driving, community activity, and occupation  PERSONAL FACTORS:  no specific factors  are also affecting patient's functional outcome.   REHAB POTENTIAL: Good  CLINICAL DECISION MAKING: Stable/uncomplicated  EVALUATION COMPLEXITY: Low   GOALS: Goals reviewed with patient? Yes  SHORT TERM GOALS: (target date for Short term goals are 3 weeks 06/17/2022)  1.Patient will demonstrate independent use of home exercise program to maintain progress from in clinic treatments. Goal status: New  LONG TERM GOALS: (target dates for all long term goals are 10 weeks  08/05/2022 )   1. Patient will demonstrate/report pain at worst less than or equal to 2/10 to facilitate minimal limitation in daily activity secondary to pain symptoms. Goal status: New   2. Patient will demonstrate independent use of  home exercise program to facilitate ability to maintain/progress functional gains from skilled physical therapy services. Goal status: New   3. Patient will demonstrate FOTO outcome > or = 60 % to indicate reduced disability due to condition. Goal status: New   4.  Patient will demonstrate cervical AROM WFL s symptoms to facilitate usual head movements for daily activity including driving, self care.   Goal  status: New   5.  Patient will demonstrate LUE MMT 5/5 throughout s symptoms to facilitate usual daily lifting, carrying and movement at PLOF.   Goal status: New   6.  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  Goal status: New   7.  Patient will demonstrate/report ability to sleep s disruption due to symptoms.  Goal Status: New   PLAN:  PT FREQUENCY: 1-2x/week  PT DURATION: 10 weeks  PLANNED INTERVENTIONS: Therapeutic exercises, Therapeutic activity, Neuro Muscular re-education, Balance training, Gait training, Patient/Family education, Joint mobilization, Stair training, DME instructions, Dry Needling, Electrical stimulation, Cryotherapy, vasopneumatic device,Traction, Moist heat, Taping, Ultrasound, Ionotophoresis 4mg /ml Dexamethasone, and aquatic therapy, Manual therapy.  All included unless contraindicated  PLAN FOR NEXT SESSION: Check HEP use/response.  Possible cervical traction , Lt infraspinatus dry needling pending symptom response from initial treatment and Pt desires.   Chyrel MassonMichael Seville Downs, PT, DPT, OCS, ATC 05/27/22  11:57 AM

## 2022-05-27 ENCOUNTER — Encounter: Payer: Self-pay | Admitting: Rehabilitative and Restorative Service Providers"

## 2022-05-27 ENCOUNTER — Ambulatory Visit: Payer: No Typology Code available for payment source | Admitting: Rehabilitative and Restorative Service Providers"

## 2022-05-27 ENCOUNTER — Other Ambulatory Visit: Payer: Self-pay

## 2022-05-27 DIAGNOSIS — R293 Abnormal posture: Secondary | ICD-10-CM

## 2022-05-27 DIAGNOSIS — M542 Cervicalgia: Secondary | ICD-10-CM

## 2022-05-27 DIAGNOSIS — M6281 Muscle weakness (generalized): Secondary | ICD-10-CM

## 2022-05-27 DIAGNOSIS — M79602 Pain in left arm: Secondary | ICD-10-CM | POA: Diagnosis not present

## 2022-06-07 ENCOUNTER — Encounter: Payer: PRIVATE HEALTH INSURANCE | Admitting: Physical Therapy

## 2022-06-07 ENCOUNTER — Telehealth: Payer: Self-pay | Admitting: Physical Therapy

## 2022-06-07 NOTE — Telephone Encounter (Signed)
I called pt to follow up after she missed her 1:00 pm PT appointment today. I left our number if pt wished to reschedule her missed visit. I also left a reminder of pt's next scheduled appointment at 1:00 on 06/14/22.   Narda Amber, PT, MPT 06/07/22 1:36 PM

## 2022-06-14 ENCOUNTER — Encounter: Payer: Self-pay | Admitting: Rehabilitative and Restorative Service Providers"

## 2022-06-14 ENCOUNTER — Ambulatory Visit: Payer: No Typology Code available for payment source | Admitting: Rehabilitative and Restorative Service Providers"

## 2022-06-14 DIAGNOSIS — M6281 Muscle weakness (generalized): Secondary | ICD-10-CM | POA: Diagnosis not present

## 2022-06-14 DIAGNOSIS — R293 Abnormal posture: Secondary | ICD-10-CM

## 2022-06-14 DIAGNOSIS — M542 Cervicalgia: Secondary | ICD-10-CM | POA: Diagnosis not present

## 2022-06-14 DIAGNOSIS — M79602 Pain in left arm: Secondary | ICD-10-CM

## 2022-06-14 NOTE — Therapy (Addendum)
OUTPATIENT PHYSICAL THERAPY TREATMENT /DISCHARGE   Patient Name: Lindsay Carroll MRN: [id number] DOB:1955-06-07, 67 y.o., female Today's Date: 06/14/2022  END OF SESSION:  PT End of Session - 06/14/22 1353     Visit Number 2    Number of Visits 20    Date for PT Re-Evaluation 08/05/22    Authorization Type [employer] $50 copay    Progress Note Due on Visit 10    PT Start Time 1310    PT Stop Time 1340    PT Time Calculation (min) 30 min    Activity Tolerance Patient tolerated treatment well    Behavior During Therapy [facility] for tasks assessed/performed              Past Medical History:  Diagnosis Date   Anemia due to blood loss, chronic    hx anemia but had hospital admission 04-21-2020 for heavy vaginal bleeding , abd. pain, fatigue, Hg 4.5;  pt transfused 3 units PRBCs, pt discharged 04-23-2020 Hg 8.9;  pt is scheduled for hysteroscopy with myosure 04-30-2020 by Dr Conni Elliot (gyn)   PMB (postmenopausal bleeding)    chronic   Submucous uterine fibroid    Wears partial dentures    upper only   Past Surgical History:  Procedure Laterality Date   DILATATION & CURETTAGE/HYSTEROSCOPY WITH MYOSURE N/A 04/30/2020   Procedure: DILATATION & CURETTAGE/HYSTEROSCOPY WITH MYOSURE;  Surgeon: Toy Baker, DO;  Location: [facility];  Service: Gynecology;  Laterality: N/A;   NO PAST SURGERIES     ROBOTIC ASSISTED TOTAL HYSTERECTOMY WITH BILATERAL SALPINGO OOPHERECTOMY N/A 07/22/2020   Procedure: XI ROBOTIC ASSISTED TOTAL HYSTERECTOMY WITH BILATERAL SALPINGO OOPHORECTOMY, REPAIR OF PERI URETHRAL LACERATION;  Surgeon: Willodean Rosenthal, MD;  Location: [facility];  Service: Gynecology;  Laterality: N/A;   Patient Active Problem List   Diagnosis Date Noted   Postoperative state 07/22/2020   Pelvic pain 07/22/2020   Post-menopausal bleeding 07/22/2020   Fibroids, intramural 07/22/2020   Laceration of vulva 07/22/2020   Post-operative state 07/22/2020    Vaginal bleeding 04/21/2020    PCP: Olive Bass, FNP  REFERRING PROVIDER: Cammy Copa, MD  REFERRING DIAG: [phone] (ICD-10-CM) - Pain of left upper extremity  THERAPY DIAG:  Cervicalgia  Pain in left arm  Muscle weakness (generalized)  Abnormal posture  Rationale for Evaluation and Treatment: Rehabilitation  ONSET DATE: 03/24/2022 per MD note  SUBJECTIVE:  SUBJECTIVE STATEMENT: Pt indicated complaints in Lt elbow area, intermittent and noted with driving at times.  Uncertain of other activities.  Pt indicated reduced neck and tingling in arm since last viist.    PERTINENT HISTORY:  Has had physical therapy at different clinic than [facility] 2x (about a month ago)   Had symptoms 6 years ago with resolution with physical therapy care.  She reported some arm movements helped.  Had traction in that time.   PAIN:  NPRS scale: current 3/10, at worst in last 2-3 weeks 3/10 Pain location: cervical, Lt arm  Pain description: sharp in past, tingling at times Aggravating factors: no specific activities reported, worse at night Relieving factors: nothing specific  PRECAUTIONS: None  WEIGHT BEARING RESTRICTIONS: No  FALLS:  Has patient fallen in last 6 months? 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   PATIENT GOALS: Reduce pain, get better.    OBJECTIVE:  IMAGING: 05/27/2022 Chart review: Neck on 04/24/2022: AP lateral radiographs cervical spine reviewed.  Lordosis maintained.   Patient does have mild to moderate degenerative disc disease at C5-6 and  the cervical  spine.  Less severe facet arthritis is present at this level  and below.  No spondylolisthesis or compression fractures.   Shoulder Lt on 04/24/2022: AP lateral radiographs left shoulder reviewed.  No acute fracture or  dislocation.  No glenohumeral joint arthritis and mild AC joint arthritis  present.  Visualized lung fields clear.  Acromiohumeral distance  maintained.   PATIENT SURVEYS:  05/27/2022 FOTO intake: 48   predicted:  60  COGNITION: 05/27/2022 Overall cognitive status: Within functional limits for tasks assessed  SENSATION: 05/27/2022 Mild hypersensitivity in C5, C7 on Lt compared to Rt to light touch.    POSTURE: 05/27/2022 rounded shoulders and forward head  PALPATION: 05/27/2022 Trigger points c possible concordant arm symptoms noted in Lt infraspinatus   CERVICAL ROM:  05/27/2022: no specific impact on symptoms in Lt arm during cervical mobility check  ROM AROM (deg) 05/27/2022 AROM 06/14/2022  Flexion 60 68  Extension 50 52  Right lateral flexion    Left lateral flexion    Right rotation 75 73  Left rotation 65 72   (Blank rows = not tested)  UPPER EXTREMITY ROM:  ROM Right 05/27/2022 Left 05/27/2022  Shoulder flexion [facility]  Shoulder extension    Shoulder abduction    Shoulder adduction    Shoulder extension    Shoulder internal rotation    Shoulder external rotation    Elbow flexion    Elbow extension    Wrist flexion    Wrist extension    Wrist ulnar deviation    Wrist radial deviation    Wrist pronation    Wrist supination     (Blank rows = not tested)  UPPER EXTREMITY MMT:  MMT Right 05/27/2022 Left 05/27/2022 Left 06/14/2022  Shoulder flexion 5/5 5/5 c mild discomfort 5/5  Shoulder extension     Shoulder abduction 5/5 4+/5 c mild discomfort 5/5  Shoulder adduction     Shoulder extension     Shoulder internal rotation 5/5 5/5 5/5  Shoulder external rotation 5/5 5/5 5/5  Middle trapezius     Lower trapezius     Elbow flexion 5/5  5/5 5/5  Elbow extension 5/5 5/5 5/5  Wrist flexion  Wrist extension     Wrist ulnar deviation     Wrist radial deviation     Wrist pronation     Wrist supination     Grip strength      (Blank rows = not tested)  CERVICAL SPECIAL TESTS:  05/27/2022 Spurling's Compression: (-) bilaterally Cervical distraction supine: (+) reduction of Lt shoulder discomfort in performance  FUNCTIONAL TESTS:  05/27/2022 No specific testing   TODAY'S TREATMENT:                                                                    DATE: 06/14/2022 Therex: Extended time spent in review of HEP and education on why to perform and importance of consistency in use.  Pt had expressed desire for machine work. May due in future but detailed education on importance of HEP due to the patient not having access to gym for use of machines at home.  Tband rows green 2x 10 bilateral Tband gh ext green 2 x 10 bilateral Tband bilateral shoulder ER c scapular retraction green 2 x 10 Tband triceps extension standing at door 2 x 10     TODAY'S TREATMENT:                                                                    DATE: 05/27/2022 Therex:    HEP instruction/performance c cues for techniques, handout provided.  Trial set performed of each for comprehension and symptom assessment.  See below for exercise list  Manual: Supine intermittent cervical distraction.   PATIENT EDUCATION:  05/27/2022 Education details: HEP, POC Person educated: Patient Education method: Programmer, multimedia, Demonstration, Verbal cues, and Handouts Education comprehension: verbalized understanding, returned demonstration, and verbal cues required  HOME EXERCISE PROGRAM: Access Code: [email or web address] URL: [email or web address] Date: 06/14/2022 Prepared by: Chyrel Masson  Exercises - Supine Deep Neck Flexor Training  - 1-2 x daily - 7 x weekly - 1 sets - 10 reps - 5 hold - Supine Scapular Retraction  - 1-2 x daily - 7 x weekly - 1  sets - 10 reps - 5 hold - Shoulder External Rotation and Scapular Retraction  - 3-5 x daily - 7 x weekly - 1 sets - 5 reps - 2 hold - Seated Cervical Retraction  - 1-2 x daily - 7 x weekly - 1 sets - 10 reps - 5 hold - Seated Posterior Shoulder Stretch  - 1-2 x daily - 7 x weekly - 1 sets - 3-5 reps - 15 hold - Standing Bilateral Low Shoulder Row with Anchored Resistance  - 1-2 x daily - 7 x weekly - 2-3 sets - 10-15 reps - Shoulder Extension with Resistance  - 1-2 x daily - 7 x weekly - 2-3 sets - 10-15 reps - Shoulder External Rotation and Scapular Retraction with Resistance  - 1-2 x daily - 7 x weekly - 1-2 sets - 10-15 reps - Standing Elbow Extension with Anchored Resistance at Wall (Mirrored)  - 1-2 x daily - 7 x weekly - 2-3 sets -  10 reps  ASSESSMENT:  CLINICAL IMPRESSION: Indications of reduced symptoms compared to previous.  Check of Lt shoulder strength showed improvements.  Pt had expressed desire for machine work. May due in future but detailed education on importance of HEP due to the patient not having access to gym for use of machines at home.  If symptoms continue to improve, possible HEP trial?   OBJECTIVE IMPAIRMENTS: decreased activity tolerance, decreased endurance, decreased mobility, decreased ROM, decreased strength, impaired perceived functional ability, increased muscle spasms, impaired flexibility, impaired UE functional use, improper body mechanics, postural dysfunction, and pain.   ACTIVITY LIMITATIONS: carrying, lifting, sitting, and sleeping  PARTICIPATION LIMITATIONS: meal prep, cleaning, laundry, interpersonal relationship, driving, community activity, and occupation  PERSONAL FACTORS:  no specific factors  are also affecting patient's functional outcome.   REHAB POTENTIAL: Good  CLINICAL DECISION MAKING: Stable/uncomplicated  EVALUATION COMPLEXITY: Low   GOALS: Goals reviewed with patient? Yes  SHORT TERM GOALS: (target date for Short term goals are  3 weeks 06/17/2022)  1.Patient will demonstrate independent use of home exercise program to maintain progress from in clinic treatments. Goal status: on going 06/14/2022  LONG TERM GOALS: (target dates for all long term goals are 10 weeks  08/05/2022 )   1. Patient will demonstrate/report pain at worst less than or equal to 2/10 to facilitate minimal limitation in daily activity secondary to pain symptoms. Goal status: New   2. Patient will demonstrate independent use of home exercise program to facilitate ability to maintain/progress functional gains from skilled physical therapy services. Goal status: New   3. Patient will demonstrate FOTO outcome > or = 60 % to indicate reduced disability due to condition. Goal status: New   4.  Patient will demonstrate cervical AROM WFL s symptoms to facilitate usual head movements for daily activity including driving, self care.   Goal status: New   5.  Patient will demonstrate LUE MMT 5/5 throughout s symptoms to facilitate usual daily lifting, carrying and movement at PLOF.   Goal status: New   6.  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  Goal status: New   7.  Patient will demonstrate/report ability to sleep s disruption due to symptoms.  Goal Status: New   PLAN:  PT FREQUENCY: 1-2x/week  PT DURATION: 10 weeks  PLANNED INTERVENTIONS: Therapeutic exercises, Therapeutic activity, Neuro Muscular re-education, Balance training, Gait training, Patient/Family education, Joint mobilization, Stair training, DME instructions, Dry Needling, Electrical stimulation, Cryotherapy, vasopneumatic device,Traction, Moist heat, Taping, Ultrasound, Ionotophoresis 4mg /ml Dexamethasone, and aquatic therapy, Manual therapy.  All included unless contraindicated  PLAN FOR NEXT SESSION: Use UBE/machines some.   Chyrel Masson, PT, DPT, OCS, ATC 06/14/22  1:53 PM    PHYSICAL THERAPY DISCHARGE  SUMMARY  Visits from Start of Care: 2  Current functional level related to goals / functional outcomes: See note   Remaining deficits: See note   Education / Equipment: HEP  Patient goals were partially met. Patient is being discharged due to not returning since the last visit.   Chyrel Masson, PT, DPT, OCS, ATC 08/05/22  9:52 AM

## 2022-06-21 ENCOUNTER — Encounter: Payer: PRIVATE HEALTH INSURANCE | Admitting: Rehabilitative and Restorative Service Providers"

## 2022-06-28 ENCOUNTER — Encounter: Payer: PRIVATE HEALTH INSURANCE | Admitting: Rehabilitative and Restorative Service Providers"

## 2022-07-23 ENCOUNTER — Ambulatory Visit: Payer: PRIVATE HEALTH INSURANCE | Admitting: Family
